# Patient Record
Sex: Female | Born: 1995 | Race: White | Hispanic: No | Marital: Single | Smoking: Former smoker
Health system: Southern US, Community
[De-identification: ages and names within clinical notes are randomized; demographics above are authoritative.]

## PROBLEM LIST (undated history)

## (undated) DIAGNOSIS — F329 Major depressive disorder, single episode, unspecified: Secondary | ICD-10-CM

## (undated) DIAGNOSIS — F4310 Post-traumatic stress disorder, unspecified: Secondary | ICD-10-CM

## (undated) DIAGNOSIS — F913 Oppositional defiant disorder: Secondary | ICD-10-CM

## (undated) DIAGNOSIS — F1990 Other psychoactive substance use, unspecified, uncomplicated: Secondary | ICD-10-CM

## (undated) DIAGNOSIS — J45909 Unspecified asthma, uncomplicated: Secondary | ICD-10-CM

## (undated) DIAGNOSIS — F419 Anxiety disorder, unspecified: Secondary | ICD-10-CM

## (undated) DIAGNOSIS — Z789 Other specified health status: Secondary | ICD-10-CM

## (undated) DIAGNOSIS — F32A Depression, unspecified: Secondary | ICD-10-CM

## (undated) DIAGNOSIS — F909 Attention-deficit hyperactivity disorder, unspecified type: Secondary | ICD-10-CM

## (undated) HISTORY — DX: Other psychoactive substance use, unspecified, uncomplicated: F19.90

## (undated) HISTORY — DX: Post-traumatic stress disorder, unspecified: F43.10

## (undated) HISTORY — PX: NO PAST SURGERIES: SHX2092

## (undated) HISTORY — DX: Oppositional defiant disorder: F91.3

## (undated) HISTORY — DX: Unspecified asthma, uncomplicated: J45.909

---
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
## 2014-01-05 ENCOUNTER — Encounter: Payer: Self-pay | Admitting: Obstetrics

## 2014-01-27 ENCOUNTER — Other Ambulatory Visit: Payer: Self-pay | Admitting: Obstetrics

## 2014-01-27 DIAGNOSIS — Z3041 Encounter for surveillance of contraceptive pills: Secondary | ICD-10-CM

## 2014-01-27 MED ORDER — LEVONORGESTREL-ETHINYL ESTRAD 0.15-30 MG-MCG PO TABS: 1.0000 | ORAL_TABLET | Freq: Every day | ORAL | Status: DC

## 2014-03-16 ENCOUNTER — Telehealth: Payer: Self-pay | Admitting: *Deleted

## 2014-03-16 NOTE — Telephone Encounter (Signed)
Pt called to office to ask if she needs any labs for ovulation or to become pregnancy.  Pt states that she is on birth control but would like to conceive in future.  Pt has concerns with becoming pregnant due to several family members not being able to conceive.  Pt made aware of conception and birth control, advised that further lab work may be needed if conception not achieved after several months.  Pt made aware that she could schedule a pre conception visit if she would like to discuss with Dr Jodi Mourning.  Pt states that she has follow up appt next week and will discuss at that visit.

## 2014-03-24 ENCOUNTER — Ambulatory Visit: Payer: Medicaid Other | Admitting: Obstetrics

## 2014-10-02 ENCOUNTER — Emergency Department (HOSPITAL_COMMUNITY)
Admission: EM | Admit: 2014-10-02 | Discharge: 2014-10-02 | Disposition: A | Discharge status: Home / Self Care | Payer: Self-pay | Attending: Emergency Medicine | Admitting: Emergency Medicine

## 2014-10-02 ENCOUNTER — Encounter (HOSPITAL_COMMUNITY): Payer: Self-pay | Admitting: Emergency Medicine

## 2014-10-02 ENCOUNTER — Emergency Department (HOSPITAL_COMMUNITY): Payer: Medicaid Other

## 2014-10-02 DIAGNOSIS — R55 Syncope and collapse: Secondary | ICD-10-CM

## 2014-10-02 DIAGNOSIS — Z79899 Other long term (current) drug therapy: Secondary | ICD-10-CM | POA: Insufficient documentation

## 2014-10-02 DIAGNOSIS — Z87891 Personal history of nicotine dependence: Secondary | ICD-10-CM | POA: Insufficient documentation

## 2014-10-02 DIAGNOSIS — K529 Noninfective gastroenteritis and colitis, unspecified: Secondary | ICD-10-CM | POA: Insufficient documentation

## 2014-10-02 DIAGNOSIS — Z3202 Encounter for pregnancy test, result negative: Secondary | ICD-10-CM | POA: Insufficient documentation

## 2014-10-02 DIAGNOSIS — Z8659 Personal history of other mental and behavioral disorders: Secondary | ICD-10-CM | POA: Insufficient documentation

## 2014-10-02 LAB — COMPREHENSIVE METABOLIC PANEL
ALBUMIN: 3.8 g/dL (ref 3.5–5.0)
ALK PHOS: 66 U/L (ref 38–126)
ALT: 19 U/L (ref 14–54)
AST: 22 U/L (ref 15–41)
Anion gap: 8 (ref 5–15)
BILIRUBIN TOTAL: 0.7 mg/dL (ref 0.3–1.2)
BUN: 7 mg/dL (ref 6–20)
CO2: 25 mmol/L (ref 22–32)
CREATININE: 0.64 mg/dL (ref 0.44–1.00)
Calcium: 9.3 mg/dL (ref 8.9–10.3)
Chloride: 106 mmol/L (ref 101–111)
GFR calc Af Amer: >60 mL/min (ref >60)
GFR calc non Af Amer: >60 mL/min (ref >60)
Glucose, Bld: 93 mg/dL (ref 65–99)
POTASSIUM: 3.9 mmol/L (ref 3.5–5.1)
Sodium: 139 mmol/L (ref 135–145)
TOTAL PROTEIN: 6.8 g/dL (ref 6.5–8.1)

## 2014-10-02 LAB — CBC
HCT: 42.4 % (ref 36.0–46.0)
Hemoglobin: 14.1 g/dL (ref 12.0–15.0)
MCH: 31.1 pg (ref 26.0–34.0)
MCHC: 33.3 g/dL (ref 30.0–36.0)
MCV: 93.4 fL (ref 78.0–100.0)
PLATELETS: 297 10*3/uL (ref 150–400)
RBC: 4.54 MIL/uL (ref 3.87–5.11)
RDW: 12.8 % (ref 11.5–15.5)
WBC: 10 10*3/uL (ref 4.0–10.5)

## 2014-10-02 LAB — URINALYSIS, ROUTINE W REFLEX MICROSCOPIC
Bilirubin Urine: NEGATIVE
Glucose, UA: NEGATIVE mg/dL
Hgb urine dipstick: NEGATIVE
KETONES UR: 15 mg/dL — AB
NITRITE: NEGATIVE
PH: 6.5 (ref 5.0–8.0)
PROTEIN: NEGATIVE mg/dL
SPECIFIC GRAVITY, URINE: 1.018 (ref 1.005–1.030)
Urobilinogen, UA: 1 mg/dL (ref 0.0–1.0)

## 2014-10-02 LAB — POC URINE PREG, ED: PREG TEST UR: NEGATIVE

## 2014-10-02 LAB — URINE MICROSCOPIC-ADD ON

## 2014-10-02 LAB — LIPASE, BLOOD: Lipase: 17 U/L — ABNORMAL LOW (ref 22–51)

## 2014-10-02 MED ORDER — ONDANSETRON 4 MG PO TBDP: 4.0000 mg | ORAL_TABLET | Freq: Three times a day (TID) | ORAL | Status: DC | PRN

## 2014-10-02 MED ORDER — ONDANSETRON HCL 4 MG/2ML IJ SOLN
4.0000 mg | Freq: Once | INTRAMUSCULAR | Status: AC
  Administered 2014-10-02: 4 mg via INTRAVENOUS
  Filled 2014-10-02: qty 2

## 2014-10-02 MED ORDER — SODIUM CHLORIDE 0.9 % IV BOLUS (SEPSIS)
20.0000 mL/kg | Freq: Once | INTRAVENOUS | Status: AC
  Administered 2014-10-02: 926 mL via INTRAVENOUS

## 2014-10-02 MED ORDER — ONDANSETRON 4 MG PO TBDP: 4.0000 mg | ORAL_TABLET | Freq: Once | ORAL | Status: DC

## 2014-10-02 NOTE — ED Notes (Signed)
Pt. Stated, I've had some fever up to 103 with diarrhea and today I passed out 2 times.

## 2014-10-02 NOTE — Discharge Instructions (Signed)
Viral Gastroenteritis Viral gastroenteritis is also known as stomach flu. This condition affects the stomach and intestinal tract. It can cause sudden diarrhea and vomiting. The illness typically lasts 3 to 8 days. Most people develop an immune response that eventually gets rid of the virus. While this natural response develops, the virus can make you quite ill. CAUSES  Many different viruses can cause gastroenteritis, such as rotavirus or noroviruses. You can catch one of these viruses by consuming contaminated food or water. You may also catch a virus by sharing utensils or other personal items with an infected person or by touching a contaminated surface. SYMPTOMS  The most common symptoms are diarrhea and vomiting. These problems can cause a severe loss of body fluids (dehydration) and a body salt (electrolyte) imbalance. Other symptoms may include:  Fever.  Headache.  Fatigue.  Abdominal pain. DIAGNOSIS  Your caregiver can usually diagnose viral gastroenteritis based on your symptoms and a physical exam. A stool sample may also be taken to test for the presence of viruses or other infections. TREATMENT  This illness typically goes away on its own. Treatments are aimed at rehydration. The most serious cases of viral gastroenteritis involve vomiting so severely that you are not able to keep fluids down. In these cases, fluids must be given through an intravenous line (IV). HOME CARE INSTRUCTIONS   Drink enough fluids to keep your urine clear or pale yellow. Drink small amounts of fluids frequently and increase the amounts as tolerated.  Ask your caregiver for specific rehydration instructions.  Avoid:  Foods high in sugar.  Alcohol.  Carbonated drinks.  Tobacco.  Juice.  Caffeine drinks.  Extremely hot or cold fluids.  Fatty, greasy foods.  Too much intake of anything at one time.  Dairy products until 24 to 48 hours after diarrhea stops.  You may consume probiotics.  Probiotics are active cultures of beneficial bacteria. They may lessen the amount and number of diarrheal stools in adults. Probiotics can be found in yogurt with active cultures and in supplements.  Wash your hands well to avoid spreading the virus.  Only take over-the-counter or prescription medicines for pain, discomfort, or fever as directed by your caregiver. Do not give aspirin to children. Antidiarrheal medicines are not recommended.  Ask your caregiver if you should continue to take your regular prescribed and over-the-counter medicines.  Keep all follow-up appointments as directed by your caregiver. SEEK IMMEDIATE MEDICAL CARE IF:   You are unable to keep fluids down.  You do not urinate at least once every 6 to 8 hours.  You develop shortness of breath.  You notice blood in your stool or vomit. This may look like coffee grounds.  You have abdominal pain that increases or is concentrated in one small area (localized).  You have persistent vomiting or diarrhea.  You have a fever.  The patient is a child younger than 3 months, and he or she has a fever.  The patient is a child older than 3 months, and he or she has a fever and persistent symptoms.  The patient is a child older than 3 months, and he or she has a fever and symptoms suddenly get worse.  The patient is a baby, and he or she has no tears when crying. MAKE SURE YOU:   Understand these instructions.  Will watch your condition.  Will get help right away if you are not doing well or get worse. Document Released: 02/20/2005 Document Revised: 05/15/2011 Document Reviewed: 12/07/2010  Exit[employer] Patient Information 2015 [place]. This information is not intended to replace advice given to you by your health care provider. Make sure you discuss any questions you have with your health care provider.  Syncope Syncope is a medical term for fainting or passing out. This means you lose consciousness and drop to  the ground. People are generally unconscious for less than 5 minutes. You may have some muscle twitches for up to 15 seconds before waking up and returning to normal. Syncope occurs more often in older adults, but it can happen to anyone. While most causes of syncope are not dangerous, syncope can be a sign of a serious medical problem. It is important to seek medical care.  CAUSES  Syncope is caused by a sudden drop in blood flow to the brain. The specific cause is often not determined. Factors that can bring on syncope include:  Taking medicines that lower blood pressure.  Sudden changes in posture, such as standing up quickly.  Taking more medicine than prescribed.  Standing in one place for too long.  Seizure disorders.  Dehydration and excessive exposure to heat.  Low blood sugar (hypoglycemia).  Straining to have a bowel movement.  Heart disease, irregular heartbeat, or other circulatory problems.  Fear, emotional distress, seeing blood, or severe pain. SYMPTOMS  Right before fainting, you may:  Feel dizzy or light-headed.  Feel nauseous.  See all white or all black in your field of vision.  Have cold, clammy skin. DIAGNOSIS  Your health care provider will ask about your symptoms, perform a physical exam, and perform an electrocardiogram (ECG) to record the electrical activity of your heart. Your health care provider may also perform other heart or blood tests to determine the cause of your syncope which may include:  Transthoracic echocardiogram (TTE). During echocardiography, sound waves are used to evaluate how blood flows through your heart.  Transesophageal echocardiogram (TEE).  Cardiac monitoring. This allows your health care provider to monitor your heart rate and rhythm in real time.  Holter monitor. This is a portable device that records your heartbeat and can help diagnose heart arrhythmias. It allows your health care provider to track your heart activity  for several days, if needed.  Stress tests by exercise or by giving medicine that makes the heart beat faster. TREATMENT  In most cases, no treatment is needed. Depending on the cause of your syncope, your health care provider may recommend changing or stopping some of your medicines. HOME CARE INSTRUCTIONS  Have someone stay with you until you feel stable.  Do not drive, use machinery, or play sports until your health care provider says it is okay.  Keep all follow-up appointments as directed by your health care provider.  Lie down right away if you start feeling like you might faint. Breathe deeply and steadily. Wait until all the symptoms have passed.  Drink enough fluids to keep your urine clear or pale yellow.  If you are taking blood pressure or heart medicine, get up slowly and take several minutes to sit and then stand. This can reduce dizziness. SEEK IMMEDIATE MEDICAL CARE IF:   You have a severe headache.  You have unusual pain in the chest, abdomen, or back.  You are bleeding from your mouth or rectum, or you have black or tarry stool.  You have an irregular or very fast heartbeat.  You have pain with breathing.  You have repeated fainting or seizure-like jerking during an episode.  You faint when  sitting or lying down.  You have confusion.  You have trouble walking.  You have severe weakness.  You have vision problems. If you fainted, call your local emergency services (911 in [place] Do not drive yourself to the hospital.  MAKE SURE YOU:  Understand these instructions.  Will watch your condition.  Will get help right away if you are not doing well or get worse. Document Released: 02/20/2005 Document Revised: 02/25/2013 Document Reviewed: 04/21/2011 [facility] Patient Information 2015 [place], [place]. This information is not intended to replace advice given to you by your health care provider. Make sure you discuss any questions you have with your health  care provider.

## 2014-10-02 NOTE — ED Provider Notes (Signed)
CSN: [id number]     Arrival date & time 10/02/14  1413 History   First MD Initiated Contact with Patient 10/02/14 1618     Chief Complaint  Patient presents with  . Fever  . Diarrhea  . Nausea  . Dizziness     (Consider location/radiation/quality/duration/timing/severity/associated sxs/prior Treatment) HPI Comments: 19 year old who presents with fever up to 103. Patient with some vomiting and diarrhea. Vomit is nonbloody nonbilious about 3 times a day. Patient with nonbloody diarrhea about 4 times a day. Patient passed out when she awoke, and passed out later in the day. No prior history of syncope. No dysuria, no cough, no sore throat.  Patient is a 19 y.o. female presenting with fever, diarrhea, and dizziness. The history is provided by the patient. No language interpreter was used.  Fever Max temp prior to arrival:  103 Temp source:  Subjective Severity:  Moderate Onset quality:  Sudden Duration:  2 days Timing:  Intermittent Progression:  Unchanged Chronicity:  New Relieved by:  None tried Worsened by:  Nothing tried Ineffective treatments:  None tried Associated symptoms: diarrhea   Associated symptoms: no congestion, no cough, no ear pain, no sore throat and no vomiting   Diarrhea:    Quality:  Watery   Number of occurrences:  3   Severity:  Mild   Duration:  1 day   Timing:  Intermittent   Progression:  Unchanged Diarrhea Associated symptoms: fever   Associated symptoms: no vomiting   Dizziness Associated symptoms: diarrhea   Associated symptoms: no vomiting     Past Medical History  Diagnosis Date  . ADHD (attention deficit hyperactivity disorder)   . Depressed   . Depression    History reviewed. No pertinent past surgical history. Family History  Problem Relation Age of Onset  . Cancer Sister    History  Substance Use Topics  . Smoking status: Former Smoker -- 0.20 packs/day    Types: Cigarettes    Quit date: 07/05/2011  . Smokeless tobacco: Never  Used  . Alcohol Use: No   OB History    Gravida Para Term Preterm AB TAB SAB Ectopic Multiple Living   1 0 0 0 1 0 1 0 0 0      Review of Systems  Constitutional: Positive for fever.  HENT: Negative for congestion, ear pain and sore throat.   Respiratory: Negative for cough.   Gastrointestinal: Positive for diarrhea. Negative for vomiting.  Neurological: Positive for dizziness.  All other systems reviewed and are negative.     Allergies  Review of patient's allergies indicates no known allergies.  Home Medications   Prior to Admission medications   Medication Sig Start Date End Date Taking? Authorizing Provider  levonorgestrel-ethinyl estradiol (NORDETTE) 0.15-30 MG-MCG tablet Take 1 tablet by mouth daily. 01/27/14   Shelly Bombard, MD  metroNIDAZOLE (FLAGYL) 500 MG tablet Take 1 tablet (500 mg total) by mouth 2 (two) times daily. 11/25/13   Shelly Bombard, MD  omeprazole (PRILOSEC) 20 MG capsule Take 1 capsule (20 mg total) by mouth daily. 10/20/13   Serita Grit, MD  ondansetron (ZOFRAN ODT) 4 MG disintegrating tablet Take 1 tablet (4 mg total) by mouth every 8 (eight) hours as needed for nausea or vomiting. 10/02/14   Louanne Skye, MD   BP 111/59 mmHg  Pulse 76  Temp(Src) 98.1 F (36.7 C) (Oral)  Resp 16  Ht 4\' 11"  (1.499 m)  Wt 102 lb (46.267 kg)  BMI 20.59 kg/m2  SpO2 100%  LMP 09/23/2014 Physical Exam  Constitutional: She is oriented to person, place, and time. She appears well-developed and well-nourished.  HENT:  Head: Normocephalic and atraumatic.  Right Ear: External ear normal.  Left Ear: External ear normal.  Mouth/Throat: Oropharynx is clear and moist.  Eyes: Conjunctivae and EOM are normal.  Neck: Normal range of motion. Neck supple.  Cardiovascular: Normal rate, normal heart sounds and intact distal pulses.   Pulmonary/Chest: Effort normal and breath sounds normal. She has no wheezes. She has no rales.  Abdominal: Soft. Bowel sounds are normal. There  is no tenderness. There is no rebound.  Musculoskeletal: Normal range of motion.  Neurological: She is alert and oriented to person, place, and time.  Skin: Skin is warm.  Nursing note and vitals reviewed.   ED Course  Procedures (including critical care time) Labs Review Labs Reviewed  LIPASE, BLOOD - Abnormal; Notable for the following:    Lipase 17 (*)    All other components within normal limits  URINALYSIS, ROUTINE W REFLEX MICROSCOPIC (NOT AT [facility]) - Abnormal; Notable for the following:    APPearance CLOUDY (*)    Ketones, ur 15 (*)    Leukocytes, UA MODERATE (*)    All other components within normal limits  URINE MICROSCOPIC-ADD ON - Abnormal; Notable for the following:    Squamous Epithelial / LPF MANY (*)    Bacteria, UA FEW (*)    All other components within normal limits  URINE CULTURE  COMPREHENSIVE METABOLIC PANEL  CBC  POC URINE PREG, ED    Imaging Review Dg Abd Acute W/chest  10/02/2014   CLINICAL DATA:  Vomiting and syncope.  EXAM: DG ABDOMEN ACUTE W/ 1V CHEST  COMPARISON:  10/20/2013  FINDINGS: There is no evidence of dilated bowel loops or free intraperitoneal air. No radiopaque calculi or other significant radiographic abnormality is seen. Heart size and mediastinal contours are within normal limits. Both lungs are clear.  IMPRESSION: Negative abdominal radiographs.  No acute cardiopulmonary disease.   Electronically Signed   By: Andreas Newport M.D.   On: 10/02/2014 17:16     EKG Interpretation None      MDM   Final diagnoses:  Vasovagal syncope  Gastroenteritis    18y with vomiting and diarrhea.  The symptoms started today.  Non bloody, non bilious.    No signs of abd tenderness to suggest appy or surgical abdomen.  Given the syncope, will obtain ekg, and cxr, and give ivf. Not bloody diarrhea to suggest bacterial cause or HUS. Will give zofran   Pt tolerating apple juice after zofran.  Will dc home with zofran.  Discussed signs of dehydration  and vomiting that warrant re-eval.  Family agrees with plan      Louanne Skye, MD 10/02/14 1859

## 2014-10-04 LAB — URINE CULTURE

## 2015-05-15 ENCOUNTER — Encounter (HOSPITAL_COMMUNITY): Payer: Self-pay | Admitting: Emergency Medicine

## 2015-05-15 ENCOUNTER — Emergency Department (HOSPITAL_COMMUNITY)
Admission: EM | Admit: 2015-05-15 | Discharge: 2015-05-16 | Disposition: A | Discharge status: Home / Self Care | Payer: Medicaid Other | Attending: Emergency Medicine | Admitting: Emergency Medicine

## 2015-05-15 ENCOUNTER — Emergency Department (HOSPITAL_COMMUNITY): Payer: Medicaid Other

## 2015-05-15 DIAGNOSIS — Z3202 Encounter for pregnancy test, result negative: Secondary | ICD-10-CM

## 2015-05-15 DIAGNOSIS — R55 Syncope and collapse: Secondary | ICD-10-CM | POA: Diagnosis not present

## 2015-05-15 DIAGNOSIS — O9989 Other specified diseases and conditions complicating pregnancy, childbirth and the puerperium: Secondary | ICD-10-CM | POA: Insufficient documentation

## 2015-05-15 DIAGNOSIS — Z792 Long term (current) use of antibiotics: Secondary | ICD-10-CM | POA: Insufficient documentation

## 2015-05-15 DIAGNOSIS — Z3A08 8 weeks gestation of pregnancy: Secondary | ICD-10-CM | POA: Diagnosis not present

## 2015-05-15 DIAGNOSIS — Z79899 Other long term (current) drug therapy: Secondary | ICD-10-CM | POA: Insufficient documentation

## 2015-05-15 DIAGNOSIS — Z8659 Personal history of other mental and behavioral disorders: Secondary | ICD-10-CM | POA: Insufficient documentation

## 2015-05-15 DIAGNOSIS — O209 Hemorrhage in early pregnancy, unspecified: Secondary | ICD-10-CM | POA: Insufficient documentation

## 2015-05-15 DIAGNOSIS — Z3491 Encounter for supervision of normal pregnancy, unspecified, first trimester: Secondary | ICD-10-CM

## 2015-05-15 DIAGNOSIS — Z87891 Personal history of nicotine dependence: Secondary | ICD-10-CM | POA: Insufficient documentation

## 2015-05-15 DIAGNOSIS — Z793 Long term (current) use of hormonal contraceptives: Secondary | ICD-10-CM | POA: Insufficient documentation

## 2015-05-15 LAB — BASIC METABOLIC PANEL
Anion gap: 12 (ref 5–15)
BUN: 9 mg/dL (ref 6–20)
CHLORIDE: 104 mmol/L (ref 101–111)
CO2: 20 mmol/L — AB (ref 22–32)
CREATININE: 0.59 mg/dL (ref 0.44–1.00)
Calcium: 9 mg/dL (ref 8.9–10.3)
GFR calc Af Amer: >60 mL/min (ref >60)
GFR calc non Af Amer: >60 mL/min (ref >60)
Glucose, Bld: 79 mg/dL (ref 65–99)
Potassium: 3.7 mmol/L (ref 3.5–5.1)
Sodium: 136 mmol/L (ref 135–145)

## 2015-05-15 LAB — WET PREP, GENITAL
SPERM: NONE SEEN
Trich, Wet Prep: NONE SEEN
YEAST WET PREP: NONE SEEN

## 2015-05-15 LAB — URINE MICROSCOPIC-ADD ON

## 2015-05-15 LAB — HCG, QUANTITATIVE, PREGNANCY: hCG, Beta Chain, Quant, S: <1 m[IU]/mL (ref <5)

## 2015-05-15 LAB — URINALYSIS, ROUTINE W REFLEX MICROSCOPIC
Bilirubin Urine: NEGATIVE
GLUCOSE, UA: NEGATIVE mg/dL
Ketones, ur: NEGATIVE mg/dL
LEUKOCYTES UA: NEGATIVE
Nitrite: NEGATIVE
PROTEIN: NEGATIVE mg/dL
Specific Gravity, Urine: 1.019 (ref 1.005–1.030)
pH: 6.5 (ref 5.0–8.0)

## 2015-05-15 LAB — CBC
HCT: 40.7 % (ref 36.0–46.0)
HEMOGLOBIN: 13.4 g/dL (ref 12.0–15.0)
MCH: 30.3 pg (ref 26.0–34.0)
MCHC: 32.9 g/dL (ref 30.0–36.0)
MCV: 92.1 fL (ref 78.0–100.0)
PLATELETS: 290 10*3/uL (ref 150–400)
RBC: 4.42 MIL/uL (ref 3.87–5.11)
RDW: 13.5 % (ref 11.5–15.5)
WBC: 10.4 10*3/uL (ref 4.0–10.5)

## 2015-05-15 LAB — I-STAT BETA HCG BLOOD, ED (MC, WL, AP ONLY): I-stat hCG, quantitative: <5 m[IU]/mL (ref <5)

## 2015-05-15 LAB — ABO/RH: ABO/RH(D): A POS

## 2015-05-15 LAB — CBG MONITORING, ED: GLUCOSE-CAPILLARY: 85 mg/dL (ref 65–99)

## 2015-05-15 NOTE — ED Notes (Signed)
Patient transported to Ultrasound 

## 2015-05-15 NOTE — ED Notes (Addendum)
Pt c/o syncopal episode 2 hours PTA. Pt reports that she was told she was "out for 5 minutes." Pt is currently [redacted] weeks pregnant. Pt reports that large amount of blood came out from vagina area while she was passed out. Pt is still with light colored vaginal bleeding.

## 2015-05-15 NOTE — ED Provider Notes (Signed)
CSN: [id number]     Arrival date & time 05/15/15  1512 History   First MD Initiated Contact with Patient 05/15/15 1905     Chief Complaint  Patient presents with  . Loss of Consciousness  . Vaginal Bleeding     (Consider location/radiation/quality/duration/timing/severity/associated sxs/prior Treatment) HPI Comments: Pt comes in with cc of syncope. Pt is G3P0, she is [redacted] weeks pregnant. She reports that she was at work and fainted. She had no dizziness, palpitations, chest pain, dib. Pt reports waking up in a pool of blood. Reports that she passed large clots. 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 Pt has no chest pain, dib. Pt has no hx of PE, DVT and denies any exogenous estrogen use, long distance travels or surgery in the past 6 weeks, active cancer, recent immobilization.   ROS 10 Systems reviewed and are negative for acute change except as noted in the HPI.     Patient is a 20 y.o. female presenting with syncope and vaginal bleeding. The history is provided by the patient.  Loss of Consciousness Vaginal Bleeding   Past Medical History  Diagnosis Date  . ADHD (attention deficit hyperactivity disorder)   . Depressed   . Depression    History reviewed. No pertinent past surgical history. Family History  Problem Relation Age of Onset  . Cancer Sister    Social History  Substance Use Topics  . Smoking status: Former Smoker -- 0.20 packs/day    Types: Cigarettes    Quit date: 07/05/2011  . Smokeless tobacco: Never Used  . Alcohol Use: No   OB History    Gravida Para Term Preterm AB TAB SAB Ectopic Multiple Living   1 0 0 0 1 0 1 0 0 0      Review of Systems  Cardiovascular: Positive for syncope.  Genitourinary: Positive for vaginal bleeding.      Allergies  Review of patient's allergies indicates no known allergies.  Home Medications   Prior to Admission medications   Medication Sig Start Date End Date Taking? Authorizing Provider   Prenatal Vit-Fe Fumarate-FA (PRENATAL MULTIVITAMIN) TABS tablet Take 1 tablet by mouth daily at 12 noon.   Yes Historical Provider, MD  levonorgestrel-ethinyl estradiol (NORDETTE) 0.15-30 MG-MCG tablet Take 1 tablet by mouth daily. 01/27/14   Shelly Bombard, MD  metroNIDAZOLE (FLAGYL) 500 MG tablet Take 1 tablet (500 mg total) by mouth 2 (two) times daily. 11/25/13   Shelly Bombard, MD  omeprazole (PRILOSEC) 20 MG capsule Take 1 capsule (20 mg total) by mouth daily. 10/20/13   Serita Grit, MD  ondansetron (ZOFRAN ODT) 4 MG disintegrating tablet Take 1 tablet (4 mg total) by mouth every 8 (eight) hours as needed for nausea or vomiting. 10/02/14   Louanne Skye, MD   BP 101/74 mmHg  Pulse 88  Temp(Src) 98.2 F (36.8 C) (Oral)  Resp 23  Ht 4\' 11"  (1.499 m)  Wt 114 lb 7 oz (51.909 kg)  BMI 23.10 kg/m2  SpO2 100%  LMP 03/07/2015 Physical Exam  Constitutional: She is oriented to person, place, and time. She appears well-developed.  HENT:  Head: Normocephalic and atraumatic.  Eyes: Conjunctivae and EOM are normal. Pupils are equal, round, and reactive to light.  Neck: Normal range of motion. Neck supple.  Cardiovascular: Normal rate, regular rhythm, normal heart sounds and intact distal pulses.   No murmur heard. Pulmonary/Chest: Effort normal and breath sounds normal. No respiratory distress. She has no wheezes.  Abdominal: Soft. Bowel sounds are normal. She exhibits no distension. There is no tenderness. There is no rebound and no guarding.  Genitourinary: Vagina normal and uterus normal.  External exam - normal, no lesions Speculum exam: Pt has no discharge, + blood Bimanual exam: Patient has no CMT, no adnexal tenderness or fullness and cervical os is closed  Neurological: She is alert and oriented to person, place, and time.  Skin: Skin is warm and dry.  Nursing note and vitals reviewed.   ED Course  Procedures (including critical care time) Labs Review Labs Reviewed  WET PREP,  GENITAL - Abnormal; Notable for the following:    Clue Cells Wet Prep HPF POC PRESENT (*)    WBC, Wet Prep HPF POC MANY (*)    All other components within normal limits  BASIC METABOLIC PANEL - Abnormal; Notable for the following:    CO2 20 (*)    All other components within normal limits  URINALYSIS, ROUTINE W REFLEX MICROSCOPIC (NOT AT [facility]) - Abnormal; Notable for the following:    Hgb urine dipstick LARGE (*)    All other components within normal limits  URINE MICROSCOPIC-ADD ON - Abnormal; Notable for the following:    Squamous Epithelial / LPF 0-5 (*)    Bacteria, UA RARE (*)    All other components within normal limits  CBC  HCG, QUANTITATIVE, PREGNANCY  CBG MONITORING, ED  I-STAT BETA HCG BLOOD, ED ([facility], [facility], [facility] ONLY)  ABO/RH  GC/CHLAMYDIA PROBE AMP ([place]) NOT AT [facility]    Imaging Review US Ob Comp Less 14 Wks  05/15/2015  CLINICAL DATA:  Pregnant patient with vaginal bleeding. First-trimester pregnancy. Patient reported [redacted] weeks pregnant. Passed out at work and passed large clot, persistent spotting. Pelvic discomfort. Beta HCG less than 1. EXAM: OBSTETRIC <14 WK [place] AND TRANSVAGINAL OB US TECHNIQUE: Both transabdominal and transvaginal ultrasound examinations were performed for complete evaluation of the gestation as well as the maternal uterus, adnexal regions, and pelvic cul-de-sac. Transvaginal technique was performed to assess early pregnancy. COMPARISON:  None. FINDINGS: Intrauterine gestational sac: Not present. Yolk sac:  Not present. Embryo:  Not present. Maternal uterus/adnexae: Uterus is anteverted measuring 5.6 x 2.1 x 2.9 cm. The endometrium measures 5 mm. No fluid in the endometrial canal. The right ovary measures 3.1 x 1.4 x 2.8 cm and contains physiologic follicles. An 8 mm echogenic structure within the right ovary may be a collapsed corpus luteal cyst. Blood flow is noted. The left ovary measures 3.8 x 2.9 x 3.8 cm. Within the left ovary is a 3.5 x 1.9 x 3.1 cm  irregularly-shaped cyst with possible daughter cyst. There are thick-walled tubular structures in both adnexa. Trace free fluid in the cul-de-sac. IMPRESSION: 1. No intrauterine pregnancy. No evidence of ectopic pregnancy. Patient's beta HCG is less than 1. 2. Thick-walled tubular structures in both adnexa, may reflect thickened tubes and may be sequela of pelvic inflammatory disease. No evidence of tubo-ovarian abscess sonographically. Electronically Signed   By: Jeb Levering M.D.   On: 05/15/2015 23:36   US Ob Transvaginal  05/15/2015  CLINICAL DATA:  Pregnant patient with vaginal bleeding. First-trimester pregnancy. Patient reported [redacted] weeks pregnant. Passed out at work and passed large clot, persistent spotting. Pelvic discomfort. Beta HCG less than 1. EXAM: OBSTETRIC <14 WK [place] AND TRANSVAGINAL OB US TECHNIQUE: Both transabdominal and transvaginal ultrasound examinations were performed for complete evaluation of the gestation as well as the maternal uterus, adnexal regions, and pelvic cul-de-sac.  Transvaginal technique was performed to assess early pregnancy. COMPARISON:  None. FINDINGS: Intrauterine gestational sac: Not present. Yolk sac:  Not present. Embryo:  Not present. Maternal uterus/adnexae: Uterus is anteverted measuring 5.6 x 2.1 x 2.9 cm. The endometrium measures 5 mm. No fluid in the endometrial canal. The right ovary measures 3.1 x 1.4 x 2.8 cm and contains physiologic follicles. An 8 mm echogenic structure within the right ovary may be a collapsed corpus luteal cyst. Blood flow is noted. The left ovary measures 3.8 x 2.9 x 3.8 cm. Within the left ovary is a 3.5 x 1.9 x 3.1 cm irregularly-shaped cyst with possible daughter cyst. There are thick-walled tubular structures in both adnexa. Trace free fluid in the cul-de-sac. IMPRESSION: 1. No intrauterine pregnancy. No evidence of ectopic pregnancy. Patient's beta HCG is less than 1. 2. Thick-walled tubular structures in both adnexa, may reflect  thickened tubes and may be sequela of pelvic inflammatory disease. No evidence of tubo-ovarian abscess sonographically. Electronically Signed   By: Jeb Levering M.D.   On: 05/15/2015 23:36   I have personally reviewed and evaluated these images and lab results as part of my medical decision-making.   EKG Interpretation   Date/Time:  Saturday May 15 2015 16:50:37 EST Ventricular Rate:  78 PR Interval:  136 QRS Duration: 76 QT Interval:  376 QTC Calculation: 428 R Axis:   97 Text Interpretation:  Normal sinus rhythm Rightward axis Borderline ECG No  acute changes No significant change since last tracing Confirmed by  Kathrynn Humble, MD, Thelma Comp [phone]) on 05/15/2015 9:31:21 PM      MDM   Final diagnoses:  Syncope and collapse  Encounter for pregnancy test with result negative    Pt comes in with cc of syncope. She had no prodrome. No cardiac hx, no PE hx. Pt has no hx of dysrhythmias, there is no family hx of premature CAD.  Pt's Hcg < 1. [place] ordered and [place] ordered. No abd abnormality on the exam.  Pt's cardiac exam is normal. Hb and BP are normal and the cardiac exam is normal.  Will d.c. PE warning signs verbally discussed and pt will return to the ER if she has chest pain, worsening dib, dizziness or fainting.      Varney Biles, MD 05/16/15 [id number]

## 2015-05-15 NOTE — Discharge Instructions (Signed)
We are very sorry to inform you that the ER results show no active pregnancy. We would like to have you see your gynecologist for further evaluation.   Syncope Syncope is a medical term for fainting or passing out. This means you lose consciousness and drop to the ground. People are generally unconscious for less than 5 minutes. You may have some muscle twitches for up to 15 seconds before waking up and returning to normal. Syncope occurs more often in older adults, but it can happen to anyone. While most causes of syncope are not dangerous, syncope can be a sign of a serious medical problem. It is important to seek medical care.  CAUSES  Syncope is caused by a sudden drop in blood flow to the brain. The specific cause is often not determined. Factors that can bring on syncope include:  Taking medicines that lower blood pressure.  Sudden changes in posture, such as standing up quickly.  Taking more medicine than prescribed.  Standing in one place for too long.  Seizure disorders.  Dehydration and excessive exposure to heat.  Low blood sugar (hypoglycemia).  Straining to have a bowel movement.  Heart disease, irregular heartbeat, or other circulatory problems.  Fear, emotional distress, seeing blood, or severe pain. SYMPTOMS  Right before fainting, you may:  Feel dizzy or light-headed.  Feel nauseous.  See all white or all black in your field of vision.  Have cold, clammy skin. DIAGNOSIS  Your health care provider will ask about your symptoms, perform a physical exam, and perform an electrocardiogram (ECG) to record the electrical activity of your heart. Your health care provider may also perform other heart or blood tests to determine the cause of your syncope which may include:  Transthoracic echocardiogram (TTE). During echocardiography, sound waves are used to evaluate how blood flows through your heart.  Transesophageal echocardiogram (TEE).  Cardiac monitoring. This  allows your health care provider to monitor your heart rate and rhythm in real time.  Holter monitor. This is a portable device that records your heartbeat and can help diagnose heart arrhythmias. It allows your health care provider to track your heart activity for several days, if needed.  Stress tests by exercise or by giving medicine that makes the heart beat faster. TREATMENT  In most cases, no treatment is needed. Depending on the cause of your syncope, your health care provider may recommend changing or stopping some of your medicines. HOME CARE INSTRUCTIONS  Have someone stay with you until you feel stable.  Do not drive, use machinery, or play sports until your health care provider says it is okay.  Keep all follow-up appointments as directed by your health care provider.  Lie down right away if you start feeling like you might faint. Breathe deeply and steadily. Wait until all the symptoms have passed.  Drink enough fluids to keep your urine clear or pale yellow.  If you are taking blood pressure or heart medicine, get up slowly and take several minutes to sit and then stand. This can reduce dizziness. SEEK IMMEDIATE MEDICAL CARE IF:   You have a severe headache.  You have unusual pain in the chest, abdomen, or back.  You are bleeding from your mouth or rectum, or you have black or tarry stool.  You have an irregular or very fast heartbeat.  You have pain with breathing.  You have repeated fainting or seizure-like jerking during an episode.  You faint when sitting or lying down.  You have confusion.  You have trouble walking.  You have severe weakness.  You have vision problems. If you fainted, call your local emergency services (911 in [place] Do not drive yourself to the hospital.    This information is not intended to replace advice given to you by your health care provider. Make sure you discuss any questions you have with your health care provider.     Document Released: 02/20/2005 Document Revised: 07/07/2014 Document Reviewed: 04/21/2011 Elsevier Interactive Patient Education 2016 [place]. Recurrent Pregnancy Loss Recurrent pregnancy loss is the loss of three or more pregnancies before 20 weeks of gestation. Losing three or more pregnancies in a row is rare.  CAUSES  The most common cause of recurrent pregnancy loss is an abnormal number of chromosomes in the developing baby (fetus). Chromosomes are the structures inside cells that hold all your genetic material. In most cases of recurrent pregnancy loss, a missing or extra chromosome keeps a baby from developing. It may not be possible to identify which chromosome is defective. Chromosome abnormalities can be inherited, but most of the time they occur by chance. Other possible causes of recurrent pregnancy loss include:  Being born with an abnormal womb structure (septate uterus).  Having noncancerous growths in your uterus (fibroids or polyps).  Having a disease that causes scarring in your uterus (Asherman syndrome).  Having a disease that causes your blood to clot (antiphospholipid syndrome).  Having a disease that increases bleeding (thrombophilia). RISK FACTORS The risk of recurrent pregnancy loss increases as your age increases. Other risk factors include:  Diabetes.  Thyroid disease.  Obesity.  Smoking.  Excessive alcohol or caffeine.  Recreational drug use. SIGNS AND SYMPTOMS  Vaginal bleeding.  Passing clots vaginally.  Abdominal pain or cramps.  Low back pain. DIAGNOSIS  Your health care provider can create an image of your womb using sound waves and a computer (ultrasound) to confirm your pregnancy by 5 or 6 weeks after you conceive. Ultrasound can also confirm a pregnancy loss. Your health care provider may do a complete physical exam, including your vagina and uterus (pelvic exam), to find possible causes of recurrent pregnancy loss.  Other tests  may include:  Ultrasound imaging to see if the structure of your uterus is normal or if you have polyps or fibroids.  Blood tests to see if you have a disease that causes recurrent pregnancy loss.  Blood tests to see if your blood clots normally.  Genetic testing of you and your partner. TREATMENT  In many cases, there is no specific treatment. Depending on the cause, possible treatments include:  Having your eggs fertilized outside your uterus (in vitro fertilization). By doing this, a health care provider may be able to select eggs without chromosome abnormalities.  Taking a blood thinner to prevent clotting if you have antiphospholipid syndrome.  Having corrective surgery if you have an abnormality in your uterus. HOME CARE INSTRUCTIONS  Follow all your health care provider's home instructions carefully. These may include:  Do not smoke or use recreational drugs.  Do not drink alcohol if you are pregnant.  Do not put anything in your vagina or have sex for 2 weeks after you have had a miscarriage.  If you are Rh negative and have had a miscarriage, you may need to get a shot of Rho (D) immune globulin. Ask your doctor if you need this shot.  Use birth control if you do not want to get pregnant. You can get pregnant [redacted] weeks after a miscarriage.  Get support from friends and loved ones. Miscarriage can be a sad and stressful event. SEEK MEDICAL CARE IF:   You have light vaginal bleeding or spotting while pregnant.  You have been trying to get pregnant without success.  You are struggling with sadness or depression after a miscarriage. SEEK IMMEDIATE MEDICAL CARE IF:  You have heavy vaginal bleeding and abdominal cramps.  You have fever, chills, and severe abdominal pain.   This information is not intended to replace advice given to you by your health care provider. Make sure you discuss any questions you have with your health care provider.   Document Released:  08/09/2007 Document Revised: 02/25/2013 Document Reviewed: 12/13/2012 Elsevier Interactive Patient Education [employer].

## 2015-05-16 NOTE — ED Notes (Signed)
Pt ambulating to rest room.

## 2015-05-17 LAB — GC/CHLAMYDIA PROBE AMP (~~LOC~~) NOT AT ARMC
CHLAMYDIA, DNA PROBE: POSITIVE — AB
NEISSERIA GONORRHEA: NEGATIVE

## 2015-05-18 ENCOUNTER — Telehealth (HOSPITAL_BASED_OUTPATIENT_CLINIC_OR_DEPARTMENT_OTHER): Payer: Self-pay | Admitting: Emergency Medicine

## 2015-05-18 NOTE — Telephone Encounter (Signed)
chart handoff to EDP for + Chlamydia

## 2015-05-19 ENCOUNTER — Telehealth: Payer: Self-pay | Admitting: *Deleted

## 2015-05-19 NOTE — ED Notes (Signed)
Spoke with patient, verified ID, informed of labs, treated per protocol, DHHS form faxed, patient informed to abstain for sexual activity x 10 days and notify sexual partners for evaluation and treatment

## 2017-11-27 ENCOUNTER — Ambulatory Visit: Payer: Self-pay | Admitting: Obstetrics

## 2019-01-06 ENCOUNTER — Inpatient Hospital Stay (HOSPITAL_COMMUNITY)
Admission: RE | Admit: 2019-01-06 | Discharge: 2019-01-09 | DRG: 885 | Disposition: A | Discharge status: Home / Self Care | Payer: PRIVATE HEALTH INSURANCE | Attending: Psychiatry | Admitting: Psychiatry

## 2019-01-06 ENCOUNTER — Encounter (HOSPITAL_COMMUNITY): Payer: Self-pay

## 2019-01-06 ENCOUNTER — Other Ambulatory Visit: Payer: Self-pay | Admitting: Behavioral Health

## 2019-01-06 ENCOUNTER — Other Ambulatory Visit: Payer: Self-pay

## 2019-01-06 DIAGNOSIS — F332 Major depressive disorder, recurrent severe without psychotic features: Principal | ICD-10-CM | POA: Diagnosis present

## 2019-01-06 DIAGNOSIS — Z20828 Contact with and (suspected) exposure to other viral communicable diseases: Secondary | ICD-10-CM | POA: Diagnosis present

## 2019-01-06 DIAGNOSIS — Z915 Personal history of self-harm: Secondary | ICD-10-CM | POA: Diagnosis not present

## 2019-01-06 DIAGNOSIS — F1220 Cannabis dependence, uncomplicated: Secondary | ICD-10-CM | POA: Diagnosis present

## 2019-01-06 DIAGNOSIS — R45851 Suicidal ideations: Secondary | ICD-10-CM | POA: Diagnosis present

## 2019-01-06 DIAGNOSIS — F4310 Post-traumatic stress disorder, unspecified: Secondary | ICD-10-CM | POA: Diagnosis present

## 2019-01-06 DIAGNOSIS — F329 Major depressive disorder, single episode, unspecified: Secondary | ICD-10-CM | POA: Diagnosis present

## 2019-01-06 DIAGNOSIS — F339 Major depressive disorder, recurrent, unspecified: Secondary | ICD-10-CM | POA: Diagnosis present

## 2019-01-06 DIAGNOSIS — Z87891 Personal history of nicotine dependence: Secondary | ICD-10-CM

## 2019-01-06 DIAGNOSIS — F411 Generalized anxiety disorder: Secondary | ICD-10-CM | POA: Diagnosis present

## 2019-01-06 HISTORY — DX: Other specified health status: Z78.9

## 2019-01-06 HISTORY — DX: Anxiety disorder, unspecified: F41.9

## 2019-01-06 LAB — SARS CORONAVIRUS 2 BY RT PCR (HOSPITAL ORDER, PERFORMED IN ~~LOC~~ HOSPITAL LAB): SARS Coronavirus 2: NEGATIVE

## 2019-01-06 MED ORDER — ACETAMINOPHEN 325 MG PO TABS
650.0000 mg | ORAL_TABLET | Freq: Four times a day (QID) | ORAL | Status: DC | PRN
  Filled 2019-01-06: qty 2
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
## 2019-01-06 NOTE — Progress Notes (Signed)
Psychoeducational Group Note  Date:  01/06/2019 Time:  2100  Group Topic/Focus:  Wrap-Up Group:   The focus of this group is to help patients review their daily goal of treatment and discuss progress on daily workbooks.  Participation Level: Did Not Attend  Participation Quality:  Not Applicable  Affect:  Not Applicable  Cognitive:  Not Applicable  Insight:  Not Applicable  Engagement in Group: Not Applicable  Additional Comments:  The patient did not attend group since she had yet to be admitted to the hallway.   Seleen Walter S 01/06/2019, 9:00 PM

## 2019-01-07 DIAGNOSIS — R45851 Suicidal ideations: Secondary | ICD-10-CM

## 2019-01-07 DIAGNOSIS — F339 Major depressive disorder, recurrent, unspecified: Secondary | ICD-10-CM | POA: Diagnosis present

## 2019-01-07 DIAGNOSIS — F332 Major depressive disorder, recurrent severe without psychotic features: Principal | ICD-10-CM

## 2019-01-07 LAB — COMPREHENSIVE METABOLIC PANEL
ALT: 20 U/L (ref 0–44)
AST: 17 U/L (ref 15–41)
Albumin: 4 g/dL (ref 3.5–5.0)
Alkaline Phosphatase: 62 U/L (ref 38–126)
Anion gap: 8 (ref 5–15)
BUN: 10 mg/dL (ref 6–20)
CO2: 27 mmol/L (ref 22–32)
Calcium: 9.2 mg/dL (ref 8.9–10.3)
Chloride: 103 mmol/L (ref 98–111)
Creatinine, Ser: 0.6 mg/dL (ref 0.44–1.00)
GFR calc Af Amer: >60 mL/min (ref >60)
GFR calc non Af Amer: >60 mL/min (ref >60)
Glucose, Bld: 87 mg/dL (ref 70–99)
Potassium: 3.7 mmol/L (ref 3.5–5.1)
Sodium: 138 mmol/L (ref 135–145)
Total Bilirubin: 0.4 mg/dL (ref 0.3–1.2)
Total Protein: 7.2 g/dL (ref 6.5–8.1)

## 2019-01-07 LAB — CBC
HCT: 47.2 % — ABNORMAL HIGH (ref 36.0–46.0)
Hemoglobin: 15.2 g/dL — ABNORMAL HIGH (ref 12.0–15.0)
MCH: 31.3 pg (ref 26.0–34.0)
MCHC: 32.2 g/dL (ref 30.0–36.0)
MCV: 97.1 fL (ref 80.0–100.0)
Platelets: 270 10*3/uL (ref 150–400)
RBC: 4.86 MIL/uL (ref 3.87–5.11)
RDW: 12.8 % (ref 11.5–15.5)
WBC: 10 10*3/uL (ref 4.0–10.5)
nRBC: 0 % (ref 0.0–0.2)

## 2019-01-07 LAB — LIPID PANEL
Cholesterol: 176 mg/dL (ref 0–200)
HDL: 72 mg/dL (ref >40)
LDL Cholesterol: 93 mg/dL (ref 0–99)
Total CHOL/HDL Ratio: 2.4 RATIO
Triglycerides: 54 mg/dL (ref <150)
VLDL: 11 mg/dL (ref 0–40)

## 2019-01-07 LAB — TSH: TSH: 2.253 u[IU]/mL (ref 0.350–4.500)

## 2019-01-07 LAB — RAPID URINE DRUG SCREEN, HOSP PERFORMED
Amphetamines: NOT DETECTED
Amphetamines: NOT DETECTED
Barbiturates: NOT DETECTED
Barbiturates: NOT DETECTED
Benzodiazepines: NOT DETECTED
Benzodiazepines: NOT DETECTED
Cocaine: NOT DETECTED
Cocaine: NOT DETECTED
Opiates: NOT DETECTED
Opiates: NOT DETECTED
Tetrahydrocannabinol: POSITIVE — AB
Tetrahydrocannabinol: POSITIVE — AB

## 2019-01-07 LAB — URINALYSIS, ROUTINE W REFLEX MICROSCOPIC
Bilirubin Urine: NEGATIVE
Glucose, UA: NEGATIVE mg/dL
Ketones, ur: 80 mg/dL — AB
Nitrite: NEGATIVE
Protein, ur: NEGATIVE mg/dL
Specific Gravity, Urine: 1.018 (ref 1.005–1.030)
pH: 7 (ref 5.0–8.0)

## 2019-01-07 LAB — HEMOGLOBIN A1C
Hgb A1c MFr Bld: 5 % (ref 4.8–5.6)
Mean Plasma Glucose: 96.8 mg/dL

## 2019-01-07 LAB — ETHANOL: Alcohol, Ethyl (B): <10 mg/dL (ref <10)

## 2019-01-07 LAB — PREGNANCY, URINE
Preg Test, Ur: NEGATIVE
Preg Test, Ur: NEGATIVE

## 2019-01-07 MED ORDER — TRAZODONE HCL 50 MG PO TABS
50.0000 mg | ORAL_TABLET | Freq: Every evening | ORAL | Status: DC | PRN
  Filled 2019-01-07: qty 1

## 2019-01-07 MED ORDER — MAGNESIUM HYDROXIDE 400 MG/5ML PO SUSP: 30.0000 mL | Freq: Every day | ORAL | Status: DC | PRN

## 2019-01-07 MED ORDER — SERTRALINE HCL 25 MG PO TABS
25.0000 mg | ORAL_TABLET | Freq: Every day | ORAL | Status: DC
  Administered 2019-01-07 – 2019-01-09 (×3): 25 mg via ORAL
  Filled 2019-01-07 (×6): qty 1

## 2019-01-07 MED ORDER — HYDROXYZINE HCL 25 MG PO TABS: 25.0000 mg | ORAL_TABLET | Freq: Three times a day (TID) | ORAL | Status: DC | PRN

## 2019-01-07 MED ORDER — ALUM & MAG HYDROXIDE-SIMETH 200-200-20 MG/5ML PO SUSP: 30.0000 mL | ORAL | Status: DC | PRN

## 2019-01-07 MED ORDER — PRAZOSIN HCL 1 MG PO CAPS
1.0000 mg | ORAL_CAPSULE | Freq: Every day | ORAL | Status: DC
  Administered 2019-01-07: 1 mg via ORAL
  Filled 2019-01-07 (×2): qty 1
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
## 2019-01-07 NOTE — Progress Notes (Signed)
Recreation Therapy Notes  Animal-Assisted Activity (AAA) Program Checklist/Progress Notes Patient Eligibility Criteria Checklist & Daily Group note for Rec Tx Intervention  Date: 11.3.20 Time: 67 Location: 300 Hall Group Room   AAA/T Program Assumption of Risk Form signed by Patient/ or Parent Legal Guardian  YES   Patient is free of allergies or sever asthma YES   Patient reports no fear of animals  YES   Patient reports no history of cruelty to animals  YES   Patient understands his/her participation is voluntary  YES   Patient washes hands before animal contact  YES   Patient washes hands after animal contact  YES   Behavioral Response: Engaged  Education: Contractor, Appropriate Animal Interaction   Education Outcome: Acknowledges understanding/In group clarification offered/Needs additional education.   Clinical Observations/Feedback: Pt attended and participated in activity.    Victorino Sparrow, LRT/CTRS         Victorino Sparrow A 01/07/2019 3:29 PM
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
## 2019-01-07 NOTE — Progress Notes (Signed)
D:  Patient's self inventory sheet, patient sleeps good, no sleep medication.  Fair appetite, normal energy level, anxiety #4.  Denied withdrawals.  Denied SI.  Denied physical problems.  Denied physical pain.  Goal better thoughts, fight depression.  Plans to think of the things she has, people she still has.  Does have discharge plans. A:  Medications administered per MD orders.  Emotional support and encouragement given patient. R:  Denied SI and HI, contracts for safety.  Denied A/V hallucinations.s  Safety maintained with 15 minute checks.

## 2019-01-08 NOTE — Progress Notes (Signed)
   01/07/19 2300  Psych Admission Type (Psych Patients Only)  Admission Status Voluntary  Psychosocial Assessment  Patient Complaints None  Eye Contact Fair  Facial Expression Animated  Affect Appropriate to circumstance  Speech Logical/coherent  Interaction Assertive  Motor Activity Slow  Appearance/Hygiene Unremarkable  Behavior Characteristics Appropriate to situation  Mood Anxious  Aggressive Behavior  Effect No apparent injury  Thought Process  Coherency WDL  Content WDL  Delusions None reported or observed  Perception WDL  Hallucination None reported or observed  Judgment WDL  Confusion None  Danger to Self  Current suicidal ideation? Denies  Self-Injurious Behavior No self-injurious ideation or behavior indicators observed or expressed   Danger to Others  Danger to Others None reported or observed

## 2019-01-08 NOTE — Plan of Care (Signed)
  Problem: Education: Goal: Mental status will improve Outcome: Progressing   Problem: Coping: Goal: Ability to demonstrate self-control will improve Outcome: Progressing   Problem: Safety: Goal: Periods of time without injury will increase Outcome: Progressing

## 2019-01-08 NOTE — Progress Notes (Signed)
Recreation Therapy Notes  Date:  11.4.20 Time: 0930 Location: [address] Group Room  Group Topic: Stress Management  Goal Area(s) Addresses:  Patient will identify positive stress management techniques. Patient will identify benefits of using stress management post d/c.  Behavioral Response: Engaged  Intervention: Stress Management  Activity :  Guided Imagery.  LRT introduced the stress management technique of guided imagery.  LRT read a script that took patients on a mental vacation to the beach.  Patients were to listen as LRT read the script to engage in activity.  Education:  Stress Management, Discharge Planning.   Education Outcome: Acknowledges Education  Clinical Observations/Feedback:  Pt attended and participated in activity.    Victorino Sparrow, LRT/CTRS         Victorino Sparrow A 01/08/2019 12:18 PM

## 2019-01-08 NOTE — Progress Notes (Signed)
Pt refused labs this morning. Encouragement and education provided.

## 2019-01-08 NOTE — Progress Notes (Signed)
[facility] MD Progress Note  01/08/2019 11:29 AM Joan Rodriguez  MRN:  [id number] Subjective: Patient is a 23 year old female with a past psychiatric history significant for depression and anxiety who was admitted to the behavioral health hospital on 01/07/2019 secondary to suicidal ideation and worsening depression.  Objective: Patient is seen and examined.  Patient is a 23 year old female with the above-stated past psychiatric history who is seen in follow-up.  She states she is doing better.  She denied any suicidal ideation this morning.  She did state that after she took the prazosin last night that she felt as though her depression and irritability increased significantly.  We discussed stopping that today.  She denied any other side effects to her current medications.  Her vital signs are stable, she is afebrile.  She slept 6.25 hours last night.  Review of her laboratories revealed essentially normal electrolytes, again, the mild elevation of her hemoglobin and hematocrit at 15.2 and 47.2.  Her urinalysis was significantly abnormal, but had 11-20 squamous epithelial cells present.  Drug screen was positive for marijuana.  Principal Problem: <principal problem not specified> Diagnosis: Active Problems:   MDD (major depressive disorder)   Major depression, recurrent (HCC)  Total Time spent with patient: 20 minutes  Past Psychiatric History: See admission H&P  Past Medical History:  Past Medical History:  Diagnosis Date  . ADHD (attention deficit hyperactivity disorder)   . Anxiety   . Depressed   . Depression   . Medical history non-contributory     Past Surgical History:  Procedure Laterality Date  . NO PAST SURGERIES     Family History:  Family History  Problem Relation Age of Onset  . Cancer Sister    Family Psychiatric  History: See admission H&P Social History:  Social History   Substance and Sexual Activity  Alcohol Use No     Social History   Substance and Sexual  Activity  Drug Use Yes  . Types: Marijuana    Social History   Socioeconomic History  . Marital status: Single    Spouse name: Not on file  . Number of children: Not on file  . Years of education: Not on file  . Highest education level: Not on file  Occupational History  . Not on file  Social Needs  . Financial resource strain: Not on file  . Food insecurity    Worry: Not on file    Inability: Not on file  . Transportation needs    Medical: Not on file    Non-medical: Not on file  Tobacco Use  . Smoking status: Former Smoker    Packs/day: 0.20    Types: Cigarettes    Quit date: 07/05/2011    Years since quitting: 7.5  . Smokeless tobacco: Never Used  Substance and Sexual Activity  . Alcohol use: No  . Drug use: Yes    Types: Marijuana  . Sexual activity: Yes    Birth control/protection: None  Lifestyle  . Physical activity    Days per week: Not on file    Minutes per session: Not on file  . Stress: Not on file  Relationships  . Social Herbalist on phone: Not on file    Gets together: Not on file    Attends religious service: Not on file    Active member of club or organization: Not on file    Attends meetings of clubs or organizations: Not on file    Relationship status:  Not on file  Other Topics Concern  . Not on file  Social History Narrative  . Not on file   Additional Social History:    Pain Medications: None reported Prescriptions: None reported Over the Counter: None reported History of alcohol / drug use?: Yes Name of Substance 1: THC 1 - Age of First Use: 23 years old 1 - Amount (size/oz): 1 gram 1 - Frequency: daily 1 - Duration: years 1 - Last Use / Amount: 1 gram today                  Sleep: Good  Appetite:  Fair  Current Medications: Current Facility-Administered Medications  Medication Dose Route Frequency Provider Last Rate Last Dose  . acetaminophen (TYLENOL) tablet 650 mg  650 mg Oral Q6H PRN Mordecai Maes,  NP      . alum & mag hydroxide-simeth (MAALOX/MYLANTA) 200-200-20 MG/5ML suspension 30 mL  30 mL Oral Q4H PRN Sharma Covert, MD      . hydrOXYzine (ATARAX/VISTARIL) tablet 25 mg  25 mg Oral TID PRN Sharma Covert, MD      . magnesium hydroxide (MILK OF MAGNESIA) suspension 30 mL  30 mL Oral Daily PRN Sharma Covert, MD      . sertraline (ZOLOFT) tablet 25 mg  25 mg Oral Daily Sharma Covert, MD   25 mg at 01/08/19 0909  . traZODone (DESYREL) tablet 50 mg  50 mg Oral QHS PRN Sharma Covert, MD        Lab Results:  Results for orders placed or performed during the hospital encounter of 01/06/19 (from the past 48 hour(s))  SARS Coronavirus 2 by RT PCR (hospital order, performed in [facility] hospital lab) Nasopharyngeal Nasopharyngeal Swab     Status: None   Collection Time: 01/06/19  3:44 PM   Specimen: Nasopharyngeal Swab  Result Value Ref Range   SARS Coronavirus 2 NEGATIVE NEGATIVE    Comment: (NOTE) If result is NEGATIVE SARS-CoV-2 target nucleic acids are NOT DETECTED. The SARS-CoV-2 RNA is generally detectable in upper and lower  respiratory specimens during the acute phase of infection. The lowest  concentration of SARS-CoV-2 viral copies this assay can detect is 250  copies / mL. A negative result does not preclude SARS-CoV-2 infection  and should not be used as the sole basis for treatment or other  patient management decisions.  A negative result may occur with  improper specimen collection / handling, submission of specimen other  than nasopharyngeal swab, presence of viral mutation(s) within the  areas targeted by this assay, and inadequate number of viral copies  (<250 copies / mL). A negative result must be combined with clinical  observations, patient history, and epidemiological information. If result is POSITIVE SARS-CoV-2 target nucleic acids are DETECTED. The SARS-CoV-2 RNA is generally detectable in upper and lower  respiratory specimens  dur ing the acute phase of infection.  Positive  results are indicative of active infection with SARS-CoV-2.  Clinical  correlation with patient history and other diagnostic information is  necessary to determine patient infection status.  Positive results do  not rule out bacterial infection or co-infection with other viruses. If result is PRESUMPTIVE POSTIVE SARS-CoV-2 nucleic acids MAY BE PRESENT.   A presumptive positive result was obtained on the submitted specimen  and confirmed on repeat testing.  While 2019 novel coronavirus  (SARS-CoV-2) nucleic acids may be present in the submitted sample  additional confirmatory testing may be necessary for epidemiological  and / or clinical management purposes  to differentiate between  SARS-CoV-2 and other Sarbecovirus currently known to infect humans.  If clinically indicated additional testing with an alternate test  methodology [phone]) is advised. The SARS-CoV-2 RNA is generally  detectable in upper and lower respiratory sp ecimens during the acute  phase of infection. The expected result is Negative. Fact Sheet for Patients:  StrictlyIdeas.no Fact Sheet for Healthcare Providers: BankingDealers.co.za This test is not yet approved or cleared by the [place] FDA and has been authorized for detection and/or diagnosis of SARS-CoV-2 by FDA under an Emergency Use Authorization (EUA).  This EUA will remain in effect (meaning this test can be used) for the duration of the COVID-19 declaration under Section 564(b)(1) of the Act, 21 U.S.C. section 360bbb-3(b)(1), unless the authorization is terminated or revoked sooner. Performed at [facility], [place] [address], [place], [place] [address]   Pregnancy, urine     Status: None   Collection Time: 01/07/19  6:09 AM  Result Value Ref Range   Preg Test, Ur NEGATIVE NEGATIVE    Comment:        THE SENSITIVITY OF  THIS METHODOLOGY IS >20 mIU/mL. Performed at [facility], [place] [address] [place], [place] [address]   Urine rapid drug screen (hosp performed)not at [facility]     Status: Abnormal   Collection Time: 01/07/19  6:09 AM  Result Value Ref Range   Opiates NONE DETECTED NONE DETECTED   Cocaine NONE DETECTED NONE DETECTED   Benzodiazepines NONE DETECTED NONE DETECTED   Amphetamines NONE DETECTED NONE DETECTED   Tetrahydrocannabinol POSITIVE (A) NONE DETECTED   Barbiturates NONE DETECTED NONE DETECTED    Comment: (NOTE) DRUG SCREEN FOR MEDICAL PURPOSES ONLY.  IF CONFIRMATION IS NEEDED FOR ANY PURPOSE, NOTIFY LAB WITHIN 5 DAYS. LOWEST DETECTABLE LIMITS FOR URINE DRUG SCREEN Drug Class                     Cutoff (ng/mL) Amphetamine and metabolites    1000 Barbiturate and metabolites    200 Benzodiazepine                 [id number] Tricyclics and metabolites     300 Opiates and metabolites        300 Cocaine and metabolites        300 THC                            50 Performed at [facility], [place] [address], [place], [place] [address]   CBC     Status: Abnormal   Collection Time: 01/07/19  6:16 AM  Result Value Ref Range   WBC 10.0 4.0 - 10.5 K/uL   RBC 4.86 3.87 - 5.11 MIL/uL   Hemoglobin 15.2 (H) 12.0 - 15.0 g/dL   HCT 47.2 (H) 36.0 - 46.0 %   MCV 97.1 80.0 - 100.0 fL   MCH 31.3 26.0 - 34.0 pg   MCHC 32.2 30.0 - 36.0 g/dL   RDW 12.8 11.5 - 15.5 %   Platelets 270 150 - 400 K/uL   nRBC 0.0 0.0 - 0.2 %    Comment: Performed at [facility], [place] [address], [place], [place] [address]  Comprehensive metabolic panel     Status: None   Collection Time: 01/07/19  6:16 AM  Result Value Ref Range   Sodium 138 135 - 145 mmol/L  Potassium 3.7 3.5 - 5.1 mmol/L   Chloride 103 98 - 111 mmol/L   CO2 27 22 - 32 mmol/L   Glucose, Bld 87 70 - 99 mg/dL   BUN 10 6 - 20 mg/dL   Creatinine, Ser 0.60 0.44 - 1.00 mg/dL   Calcium 9.2 8.9 - 10.3  mg/dL   Total Protein 7.2 6.5 - 8.1 g/dL   Albumin 4.0 3.5 - 5.0 g/dL   AST 17 15 - 41 U/L   ALT 20 0 - 44 U/L   Alkaline Phosphatase 62 38 - 126 U/L   Total Bilirubin 0.4 0.3 - 1.2 mg/dL   GFR calc non Af Amer >60 >60 mL/min   GFR calc Af Amer >60 >60 mL/min   Anion gap 8 5 - 15    Comment: Performed at [facility], [place] [address] [place], [place] [address]  Ethanol     Status: None   Collection Time: 01/07/19  6:16 AM  Result Value Ref Range   Alcohol, Ethyl (B) <10 <10 mg/dL    Comment: (NOTE) Lowest detectable limit for serum alcohol is 10 mg/dL. For medical purposes only. Performed at [facility], [place] [address] [place], [place] [address]   Hemoglobin A1c     Status: None   Collection Time: 01/07/19  6:16 AM  Result Value Ref Range   Hgb A1c MFr Bld 5.0 4.8 - 5.6 %    Comment: (NOTE) Pre diabetes:          5.7%-6.4% Diabetes:              >6.4% Glycemic control for   <7.0% adults with diabetes    Mean Plasma Glucose 96.8 mg/dL    Comment: Performed at [place] [address] [place], [place] [address]  Lipid panel     Status: None   Collection Time: 01/07/19  6:16 AM  Result Value Ref Range   Cholesterol 176 0 - 200 mg/dL   Triglycerides 54 <150 mg/dL   HDL 72 >40 mg/dL   Total CHOL/HDL Ratio 2.4 RATIO   VLDL 11 0 - 40 mg/dL   LDL Cholesterol 93 0 - 99 mg/dL    Comment:        Total Cholesterol/HDL:CHD Risk Coronary Heart Disease Risk Table                     Men   Women  1/2 Average Risk   3.4   3.3  Average Risk       5.0   4.4  2 X Average Risk   9.6   7.1  3 X Average Risk  23.4   11.0        Use the calculated Patient Ratio above and the CHD Risk Table to determine the patient's CHD Risk.        ATP III CLASSIFICATION (LDL):  <100     mg/dL   Optimal  100-129  mg/dL   Near or Above                    Optimal  130-159  mg/dL   Borderline  160-189  mg/dL   High  >190     mg/dL   Very  High Performed at [place] [address], [place], [place] [address]   TSH     Status: None   Collection Time: 01/07/19  6:16 AM  Result Value Ref Range  TSH 2.253 0.350 - 4.500 uIU/mL    Comment: Performed by a 3rd Generation assay with a functional sensitivity of <=0.01 uIU/mL. Performed at [facility], [place] [address], [place], [place] [address]   Urinalysis, Routine w reflex microscopic     Status: Abnormal   Collection Time: 01/07/19  8:31 AM  Result Value Ref Range   Color, Urine YELLOW YELLOW   APPearance CLEAR CLEAR   Specific Gravity, Urine 1.018 1.005 - 1.030   pH 7.0 5.0 - 8.0   Glucose, UA NEGATIVE NEGATIVE mg/dL   Hgb urine dipstick MODERATE (A) NEGATIVE   Bilirubin Urine NEGATIVE NEGATIVE   Ketones, ur 80 (A) NEGATIVE mg/dL   Protein, ur NEGATIVE NEGATIVE mg/dL   Nitrite NEGATIVE NEGATIVE   Leukocytes,Ua MODERATE (A) NEGATIVE   RBC / HPF 21-50 0 - 5 RBC/hpf   WBC, UA 21-50 0 - 5 WBC/hpf   Bacteria, UA RARE (A) NONE SEEN   Squamous Epithelial / LPF 11-20 0 - 5   Mucus PRESENT     Comment: Performed at [facility], [place] [address] [place], [place] [address]  Pregnancy, urine     Status: None   Collection Time: 01/07/19  8:31 AM  Result Value Ref Range   Preg Test, Ur NEGATIVE NEGATIVE    Comment:        THE SENSITIVITY OF THIS METHODOLOGY IS >20 mIU/mL. Performed at [facility], [place] [address], [place], [place] [address]   Urine rapid drug screen (hosp performed)not at [facility]     Status: Abnormal   Collection Time: 01/07/19  8:31 AM  Result Value Ref Range   Opiates NONE DETECTED NONE DETECTED   Cocaine NONE DETECTED NONE DETECTED   Benzodiazepines NONE DETECTED NONE DETECTED   Amphetamines NONE DETECTED NONE DETECTED   Tetrahydrocannabinol POSITIVE (A) NONE DETECTED   Barbiturates NONE DETECTED NONE DETECTED    Comment: (NOTE) DRUG SCREEN FOR MEDICAL PURPOSES ONLY.  IF  CONFIRMATION IS NEEDED FOR ANY PURPOSE, NOTIFY LAB WITHIN 5 DAYS. LOWEST DETECTABLE LIMITS FOR URINE DRUG SCREEN Drug Class                     Cutoff (ng/mL) Amphetamine and metabolites    1000 Barbiturate and metabolites    200 Benzodiazepine                 [id number] Tricyclics and metabolites     300 Opiates and metabolites        300 Cocaine and metabolites        300 THC                            50 Performed at [facility], [place] [address], [place], [place] [address]     Blood Alcohol level:  Lab Results  Component Value Date   [facility] <10 01/07/2019   ETH <11 [id number]    Metabolic Disorder Labs: Lab Results  Component Value Date   HGBA1C 5.0 01/07/2019   MPG 96.8 01/07/2019   No results found for: PROLACTIN Lab Results  Component Value Date   CHOL 176 01/07/2019   TRIG 54 01/07/2019   HDL 72 01/07/2019   CHOLHDL 2.4 01/07/2019   VLDL 11 01/07/2019   LDLCALC 93 01/07/2019    Physical Findings: AIMS: Facial and Oral Movements Muscles of Facial Expression: None, normal Lips and Perioral Area: None, normal Jaw: None,  normal Tongue: None, normal,Extremity Movements Upper (arms, wrists, hands, fingers): None, normal Lower (legs, knees, ankles, toes): None, normal, Trunk Movements Neck, shoulders, hips: None, normal, Overall Severity Severity of abnormal movements (highest score from questions above): None, normal Incapacitation due to abnormal movements: None, normal Patient's awareness of abnormal movements (rate only patient's report): No Awareness, Dental Status Current problems with teeth and/or dentures?: No Does patient usually wear dentures?: No  CIWA:    COWS:  COWS Total Score: 1  Musculoskeletal: Strength & Muscle Tone: within normal limits Gait & Station: normal Patient leans: N/A  Psychiatric Specialty Exam: Physical Exam  Nursing note and vitals reviewed. Constitutional: She is oriented to person, place, and time. She  appears well-developed and well-nourished.  HENT:  Head: Normocephalic and atraumatic.  Respiratory: Effort normal.  Neurological: She is alert and oriented to person, place, and time.    ROS  Blood pressure (!) 111/95, pulse 92, temperature 98.4 F (36.9 C), temperature source Oral, resp. rate 18, height 4\' 11"  (1.499 m), weight 55.8 kg, last menstrual period 12/31/2018, SpO2 100 %.Body mass index is 24.84 kg/m.  General Appearance: Casual  Eye Contact:  Fair  Speech:  Normal Rate  Volume:  Normal  Mood:  Euthymic  Affect:  Congruent  Thought Process:  Coherent and Descriptions of Associations: Intact  Orientation:  Full (Time, Place, and Person)  Thought Content:  Logical  Suicidal Thoughts:  No  Homicidal Thoughts:  No  Memory:  Immediate;   Fair Recent;   Fair Remote;   Fair  Judgement:  Intact  Insight:  Fair  Psychomotor Activity:  Normal  Concentration:  Concentration: Good and Attention Span: Good  Recall:  Good  Fund of Knowledge:  Good  Language:  Good  Akathisia:  Negative  Handed:  Right  AIMS (if indicated):     Assets:  Desire for Improvement Resilience  ADL's:  Intact  Cognition:  WNL  Sleep:  Number of Hours: 6.25     Treatment Plan Summary: Daily contact with patient to assess and evaluate symptoms and progress in treatment, Medication management and Plan : Patient is seen and examined.  Patient is a 23 year old female with the above-stated past psychiatric history who is seen in follow-up.   Diagnosis: #1 major depression, recurrent, severe without psychotic features, #2 generalized anxiety disorder, #3 posttraumatic stress disorder, #4 cannabis use disorder.  Patient is seen in follow-up.  She is doing better today.  She did have an untoward reaction with prazosin, and that will be stopped today.  I do not think she has a urinary tract infection, but clearly we will need to repeat her urinalysis to make sure there is resolution of the ketones and make  sure there is no infection.  Otherwise no other changes in her medications, and hopefully if she continues to improve we anticipate discharge in 1 to 2 days. 1.  Continue hydroxyzine 25 mg p.o. 3 times daily as needed anxiety. 2.  Continue sertraline 25 mg p.o. daily for depression and anxiety. 3.  Stop prazosin. 4.  Continue trazodone 50 mg p.o. nightly as needed insomnia. 5.  Repeat urinalysis. 6.  Disposition planning-in progress.  Sharma Covert, MD 01/08/2019, 11:29 AM

## 2019-01-08 NOTE — [facility] Suicide Risk Assessment (Signed)
[facility] INPATIENT:  Family/Significant Other Suicide Prevention Education  Suicide Prevention Education:  Contact Attempts: mother, Alphonzo Cruise [phone])  has been identified by the patient as the family member/significant other with whom the patient will be residing, and identified as the person(s) who will aid the patient in the event of a mental health crisis.  With written consent from the patient, two attempts were made to provide suicide prevention education, prior to and/or following the patient's discharge.  We were unsuccessful in providing suicide prevention education.  A suicide education pamphlet was given to the patient to share with family/significant other.  Date and time of first attempt: 01/08/2019 at 1:50pm. CSW left a HIPAA compliant voicemail and callback information. Date and time of second attempt: To be attempted  Joellen Jersey 01/08/2019, 1:53 PM

## 2019-01-08 NOTE — Tx Team (Signed)
Interdisciplinary Treatment and Diagnostic Plan Update  01/08/2019 Time of Session: 9:00am Joan Rodriguez MRN: [id number]  Principal Diagnosis: <principal problem not specified>  Secondary Diagnoses: Active Problems:   MDD (major depressive disorder)   Major depression, recurrent (HCC)   Current Medications:  Current Facility-Administered Medications  Medication Dose Route Frequency Provider Last Rate Last Dose  . acetaminophen (TYLENOL) tablet 650 mg  650 mg Oral Q6H PRN Mordecai Maes, NP      . alum & mag hydroxide-simeth (MAALOX/MYLANTA) 200-200-20 MG/5ML suspension 30 mL  30 mL Oral Q4H PRN Sharma Covert, MD      . hydrOXYzine (ATARAX/VISTARIL) tablet 25 mg  25 mg Oral TID PRN Sharma Covert, MD      . magnesium hydroxide (MILK OF MAGNESIA) suspension 30 mL  30 mL Oral Daily PRN Sharma Covert, MD      . prazosin (MINIPRESS) capsule 1 mg  1 mg Oral QHS Sharma Covert, MD   1 mg at 01/07/19 2200  . sertraline (ZOLOFT) tablet 25 mg  25 mg Oral Daily Sharma Covert, MD   25 mg at 01/08/19 0909  . traZODone (DESYREL) tablet 50 mg  50 mg Oral QHS PRN Sharma Covert, MD       PTA Medications: Medications Prior to Admission  Medication Sig Dispense Refill Last Dose  . levonorgestrel-ethinyl estradiol (NORDETTE) 0.15-30 MG-MCG tablet Take 1 tablet by mouth daily. (Patient not taking: Reported on 01/07/2019) 1 Package 11 Not Taking at Unknown time  . metroNIDAZOLE (FLAGYL) 500 MG tablet Take 1 tablet (500 mg total) by mouth 2 (two) times daily. (Patient not taking: Reported on 01/07/2019) 14 tablet 2 Completed Course at Unknown time  . omeprazole (PRILOSEC) 20 MG capsule Take 1 capsule (20 mg total) by mouth daily. (Patient not taking: Reported on 01/07/2019) 30 capsule 1 Not Taking at Unknown time  . ondansetron (ZOFRAN ODT) 4 MG disintegrating tablet Take 1 tablet (4 mg total) by mouth every 8 (eight) hours as needed for nausea or vomiting. (Patient not taking:  Reported on 01/07/2019) 20 tablet 0 Not Taking at Unknown time    Patient Stressors: Loss of both Fiancee from a murder and Bio-father  Patient Strengths: Ability for insight Communication skills Physical Health Supportive family/friends  Treatment Modalities: Medication Management, Group therapy, Case management,  1 to 1 session with clinician, Psychoeducation, Recreational therapy.   Physician Treatment Plan for Primary Diagnosis: <principal problem not specified> Long Term Goal(s):     Short Term Goals:    Medication Management: Evaluate patient's response, side effects, and tolerance of medication regimen.  Therapeutic Interventions: 1 to 1 sessions, Unit Group sessions and Medication administration.  Evaluation of Outcomes: Progressing  Physician Treatment Plan for Secondary Diagnosis: Active Problems:   MDD (major depressive disorder)   Major depression, recurrent ([place])  Long Term Goal(s):     Short Term Goals:       Medication Management: Evaluate patient's response, side effects, and tolerance of medication regimen.  Therapeutic Interventions: 1 to 1 sessions, Unit Group sessions and Medication administration.  Evaluation of Outcomes: Progressing   RN Treatment Plan for Primary Diagnosis: <principal problem not specified> Long Term Goal(s): Knowledge of disease and therapeutic regimen to maintain health will improve  Short Term Goals: Ability to verbalize feelings will improve, Ability to identify and develop effective coping behaviors will improve and Compliance with prescribed medications will improve  Medication Management: RN will administer medications as ordered by provider, will assess and  evaluate patient's response and provide education to patient for prescribed medication. RN will report any adverse and/or side effects to prescribing provider.  Therapeutic Interventions: 1 on 1 counseling sessions, Psychoeducation, Medication administration, Evaluate  responses to treatment, Monitor vital signs and CBGs as ordered, Perform/monitor CIWA, COWS, AIMS and Fall Risk screenings as ordered, Perform wound care treatments as ordered.  Evaluation of Outcomes: Progressing   LCSW Treatment Plan for Primary Diagnosis: <principal problem not specified> Long Term Goal(s): Safe transition to appropriate next level of care at discharge, Engage patient in therapeutic group addressing interpersonal concerns.  Short Term Goals: Engage patient in aftercare planning with referrals and resources, Increase social support, Increase emotional regulation, Identify triggers associated with mental health/substance abuse issues and Increase skills for wellness and recovery  Therapeutic Interventions: Assess for all discharge needs, 1 to 1 time with Social worker, Explore available resources and support systems, Assess for adequacy in community support network, Educate family and significant other(s) on suicide prevention, Complete Psychosocial Assessment, Interpersonal group therapy.  Evaluation of Outcomes: Progressing   Progress in Treatment: Attending groups: Yes. Participating in groups: Yes. Taking medication as prescribed: Yes. Toleration medication: Yes. Family/Significant other contact made: No, will contact:  mother. Patient understands diagnosis: Yes. Discussing patient identified problems/goals with staff: Yes. Medical problems stabilized or resolved: Yes. Denies suicidal/homicidal ideation: Yes. Issues/concerns per patient self-inventory: Yes.  New problem(s) identified: Yes, Describe:  grief and loss, PTSD  New Short Term/Long Term Goal(s): detox, medication management for mood stabilization; elimination of SI thoughts; development of comprehensive mental wellness/sobriety plan.  Patient Goals:  "Get on an anti-depressant."  Discharge Plan or Barriers: Patient reports her mother has coordinated outpatient follow up. CSW will contact mother to  confirm.  Reason for Continuation of Hospitalization: Anxiety Depression Medication stabilization  Estimated Length of Stay: 3-5 days  Attendees: Patient: Joan Rodriguez 01/08/2019 10:05 AM  Physician: Queen Blossom 01/08/2019 10:05 AM  Nursing: Elberta Fortis, RN 01/08/2019 10:05 AM  RN Care Manager: 01/08/2019 10:05 AM  Social Worker: Stephanie Acre, [place] 01/08/2019 10:05 AM  Recreational Therapist:  01/08/2019 10:05 AM  Other: Harriett Sine, NP 01/08/2019 10:05 AM  Other:  01/08/2019 10:05 AM  Other: 01/08/2019 10:05 AM    Scribe for Treatment Team: Joellen Jersey, [place] 01/08/2019 10:05 AM

## 2019-01-08 NOTE — Progress Notes (Signed)
Patient ID: KATHYLEEN JAKUBIAK, female   DOB: 07-19-1995, 23 y.o.   MRN: [id number]   [place] NOVEL CORONAVIRUS (COVID-19) DAILY CHECK-OFF SYMPTOMS - answer yes or no to each - every day NO YES  Have you had a fever in the past 24 hours?  . Fever (Temp > 37.80C / 100F) X   Have you had any of these symptoms in the past 24 hours? . New Cough .  Sore Throat  .  Shortness of Breath .  Difficulty Breathing .  Unexplained Body Aches   X   Have you had any one of these symptoms in the past 24 hours not related to allergies?   . Runny Nose .  Nasal Congestion .  Sneezing   X   If you have had runny nose, nasal congestion, sneezing in the past 24 hours, has it worsened?  X   EXPOSURES - check yes or no X   Have you traveled outside the state in the past 14 days?  X   Have you been in contact with someone with a confirmed diagnosis of COVID-19 or PUI in the past 14 days without wearing appropriate PPE?  X   Have you been living in the same home as a person with confirmed diagnosis of COVID-19 or a PUI (household contact)?    X   Have you been diagnosed with COVID-19?    X              What to do next: Answered NO to all: Answered YES to anything:   Proceed with unit schedule Follow the [facility] Inpatient Flowsheet.

## 2019-01-08 NOTE — [facility] Group Notes (Signed)
01/08/2019 8:45am Type of Group and Topic: Psychoeducational Group: Discharge Planning Participation Level: Active  Description of Group Discharge planning group reviews patient's anticipated discharge plans and assists patients to anticipate and address any barriers to wellness/recovery in the community. Suicide prevention education is reviewed with patients in group. Therapeutic Goals 1. Patients will state their anticipated discharge plan and mental health aftercare 2. Patients will identify potential barriers to wellness in the community setting 3. Patients will engage in problem solving, solution focused discussion of ways to anticipate and address barriers to wellness/recovery   Summary of Patient Progress Plan for Discharge/Comments:  Joan Rodriguez reports that she does not have any concerns regarding her discharge planning. She reports that she plans to return home with her mother. She also reports that she plans to remain compliant with her medications at discharge.   Transportation Means: Mother    Supports: Mother    Therapeutic Modalities: Motivational Interviewing     Joan Rodriguez, MSW, [place] Worker [facility]  Phone: [phone] 01/08/2019 3:02 PM

## 2019-01-08 NOTE — Plan of Care (Signed)
Stayed in the milieu, pleasant and cooperative. No major concern.

## 2019-01-08 NOTE — Progress Notes (Signed)
[place] Group Notes:  (Nursing/MHT/Case Management/Adjunct)  Date:  01/08/2019  Time:  2030  Type of Therapy:  wrap up group  Participation Level:  Active  Participation Quality:  Appropriate, Attentive, Sharing and Supportive  Affect:  Appropriate  Cognitive:  Appropriate  Insight:  Improving  Engagement in Group:  Engaged  Modes of Intervention:  Clarification, Education and Support  Summary of Progress/Problems:  Joan Rodriguez 01/08/2019, 9:36 PM

## 2019-01-08 NOTE — [facility] Suicide Risk Assessment (Signed)
[place] INPATIENT:  Family/Significant Other Suicide Prevention Education  Suicide Prevention Education:  Education Completed; mother, Alphonzo Cruise [phone])  has been identified by the patient as the family member/significant other with whom the patient will be residing, and identified as the person(s) who will aid the patient in the event of a mental health crisis (suicidal ideations/suicide attempt).  With written consent from the patient, the family member/significant other has been provided the following suicide prevention education, prior to the and/or following the discharge of the patient.  The suicide prevention education provided includes the following:  Suicide risk factors  Suicide prevention and interventions  National Suicide Hotline telephone number  [facility] assessment telephone number  [facility] Emergency Assistance [place] and/or Residential Mobile Crisis Unit telephone number  Request made of family/significant other to:  Remove weapons (e.g., guns, rifles, knives), all items previously/currently identified as safety concern.    Remove drugs/medications (over-the-counter, prescriptions, illicit drugs), all items previously/currently identified as a safety concern.  The family member/significant other verbalizes understanding of the suicide prevention education information provided.  The family member/significant other agrees to remove the items of safety concern listed above.  Mother, Langley Gauss, has no safety concerns for the patient discharging home. She reports she will keep a close eye on the patient and she will count her medications.   Mother confirms that she has scheduled outpatient appointments for this patient through [facility]. Mother had no questions or concerns for CSW, she voiced appreciation for the call.  Joellen Jersey 01/08/2019, 2:01 PM

## 2019-01-09 MED ORDER — SERTRALINE HCL 25 MG PO TABS: 25.0000 mg | ORAL_TABLET | Freq: Every day | ORAL | 0 refills | Status: DC

## 2019-01-09 MED ORDER — HYDROXYZINE HCL 25 MG PO TABS: 25.0000 mg | ORAL_TABLET | Freq: Three times a day (TID) | ORAL | 0 refills | Status: DC | PRN

## 2019-01-09 MED ORDER — TRAZODONE HCL 50 MG PO TABS: 50.0000 mg | ORAL_TABLET | Freq: Every evening | ORAL | 0 refills | Status: DC | PRN

## 2019-01-09 NOTE — [facility] Suicide Risk Assessment (Signed)
[facility] Discharge Suicide Risk Assessment   Principal Problem: <principal problem not specified> Discharge Diagnoses: Active Problems:   MDD (major depressive disorder)   Major depression, recurrent (HCC)   Total Time spent with patient: 20 minutes  Musculoskeletal: Strength & Muscle Tone: within normal limits Gait & Station: normal Patient leans: N/A  Psychiatric Specialty Exam: Review of Systems  All other systems reviewed and are negative.   Blood pressure 118/75, pulse (!) 109, temperature 98.1 F (36.7 C), temperature source Oral, resp. rate 16, height 4\' 11"  (1.499 m), weight 55.8 kg, last menstrual period 12/31/2018, SpO2 100 %.Body mass index is 24.84 kg/m.  General Appearance: Casual  Eye Contact::  Good  Speech:  Normal Rate409  Volume:  Normal  Mood:  Euthymic  Affect:  Congruent  Thought Process:  Coherent and Descriptions of Associations: Intact  Orientation:  Full (Time, Place, and Person)  Thought Content:  Logical  Suicidal Thoughts:  No  Homicidal Thoughts:  No  Memory:  Immediate;   Fair Recent;   Fair Remote;   Fair  Judgement:  Intact  Insight:  Fair  Psychomotor Activity:  Normal  Concentration:  Good  Recall:  Good  Fund of Knowledge:Fair  Language: Good  Akathisia:  Negative  Handed:  Right  AIMS (if indicated):     Assets:  Desire for Improvement Housing Resilience Talents/Skills  Sleep:  Number of Hours: 1.25  Cognition: WNL  ADL's:  Intact   Mental Status Per Nursing Assessment::   On Admission:  Suicidal ideation indicated by patient, Self-harm thoughts  Demographic Factors:  Caucasian, Low socioeconomic status and Unemployed  Loss Factors: NA  Historical Factors: Impulsivity  Risk Reduction Factors:   Living with another person, especially a relative and Positive social support  Continued Clinical Symptoms:  Severe Anxiety and/or Agitation Depression:   Impulsivity  Cognitive Features That Contribute To Risk:  None     Suicide Risk:  Minimal: No identifiable suicidal ideation.  Patients presenting with no risk factors but with morbid ruminations; may be classified as minimal risk based on the severity of the depressive symptoms  Follow-up Information    Patient's mother will schedule outpatient appointments Follow up.   Why: Patient's mother will schedule outpatient appointments Contact information: Patient's mother will schedule outpatient appointments          Plan Of Care/Follow-up recommendations:  Activity:  ad lib  Sharma Covert, MD 01/09/2019, 9:22 AM

## 2019-01-09 NOTE — Progress Notes (Signed)
  [facility] Adult Case Management Discharge Plan :  Will you be returning to the same living situation after discharge:  Yes,  home. At discharge, do you have transportation home?: Yes,  family picking up at 11am. Do you have the ability to pay for your medications: Yes,  [employer] insurance  Release of information consent forms completed and in the chart.  Patient to Follow up at: Follow-up Information    Patient's mother will schedule outpatient appointments Follow up.   Why: Patient's mother will schedule outpatient appointments Contact information: Patient's mother will schedule outpatient appointments       [place]. Go on 01/17/2019.   Why: Medication management with [facility] 11/13 at 1:00pm. Therapy with Tiffany [place]-[facility] on 12/18 at 2:00pm.  Contact information: [address] [place], [place] [address] [phone] (PHONE) [phone] Joylene Igo)          Next level of care provider has access to [place] and Suicide Prevention discussed: Yes,  with mother.  Have you used any form of tobacco in the last 30 days? (Cigarettes, Smokeless Tobacco, Cigars, and/or Pipes): No  Has patient been referred to the Quitline?: N/A patient is not a smoker  Patient has been referred for addiction treatment: Yes  Joellen Jersey, [place] 01/09/2019, 9:38 AM

## 2019-01-09 NOTE — Plan of Care (Signed)
Discharge note  Patient verbalizes readiness for discharge. Follow up plan explained, AVS, Transition record and SRA given. Prescriptions and teaching provided. Belongings returned and signed for. Suicide safety plan completed and signed. Patient verbalizes understanding. Patient denies SI/HI and assures this writer she will seek assistance should that change. Patient discharged to lobby where pt's father was waiting.  Problem: Education: Goal: Knowledge of [place] [facility] Education information/materials will improve Outcome: Adequate for Discharge Goal: Emotional status will improve Outcome: Adequate for Discharge Goal: Mental status will improve Outcome: Adequate for Discharge Goal: Verbalization of understanding the information provided will improve Outcome: Adequate for Discharge   Problem: Activity: Goal: Interest or engagement in activities will improve Outcome: Adequate for Discharge Goal: Sleeping patterns will improve Outcome: Adequate for Discharge   Problem: Coping: Goal: Ability to verbalize frustrations and anger appropriately will improve Outcome: Adequate for Discharge Goal: Ability to demonstrate self-control will improve Outcome: Adequate for Discharge   Problem: Health Behavior/Discharge Planning: Goal: Identification of resources available to assist in meeting health care needs will improve Outcome: Adequate for Discharge Goal: Compliance with treatment plan for underlying cause of condition will improve Outcome: Adequate for Discharge   Problem: Physical Regulation: Goal: Ability to maintain clinical measurements within normal limits will improve Outcome: Adequate for Discharge   Problem: Safety: Goal: Periods of time without injury will increase Outcome: Adequate for Discharge   Problem: Education: Goal: Utilization of techniques to improve thought processes will improve Outcome: Adequate for Discharge Goal: Knowledge of the prescribed therapeutic  regimen will improve Outcome: Adequate for Discharge   Problem: Activity: Goal: Interest or engagement in leisure activities will improve Outcome: Adequate for Discharge Goal: Imbalance in normal sleep/wake cycle will improve Outcome: Adequate for Discharge   Problem: Coping: Goal: Coping ability will improve Outcome: Adequate for Discharge Goal: Will verbalize feelings Outcome: Adequate for Discharge   Problem: Health Behavior/Discharge Planning: Goal: Ability to make decisions will improve Outcome: Adequate for Discharge Goal: Compliance with therapeutic regimen will improve Outcome: Adequate for Discharge   Problem: Role Relationship: Goal: Will demonstrate positive changes in social behaviors and relationships Outcome: Adequate for Discharge   Problem: Safety: Goal: Ability to disclose and discuss suicidal ideas will improve Outcome: Adequate for Discharge Goal: Ability to identify and utilize support systems that promote safety will improve Outcome: Adequate for Discharge   Problem: Self-Concept: Goal: Will verbalize positive feelings about self Outcome: Adequate for Discharge Goal: Level of anxiety will decrease Outcome: Adequate for Discharge   Problem: Education: Goal: Ability to make informed decisions regarding treatment will improve Outcome: Adequate for Discharge   Problem: Coping: Goal: Coping ability will improve Outcome: Adequate for Discharge   Problem: Health Behavior/Discharge Planning: Goal: Identification of resources available to assist in meeting health care needs will improve Outcome: Adequate for Discharge   Problem: Medication: Goal: Compliance with prescribed medication regimen will improve Outcome: Adequate for Discharge   Problem: Self-Concept: Goal: Ability to disclose and discuss suicidal ideas will improve Outcome: Adequate for Discharge Goal: Will verbalize positive feelings about self Outcome: Adequate for Discharge
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
## 2019-07-28 ENCOUNTER — Emergency Department (HOSPITAL_COMMUNITY)
Admission: EM | Admit: 2019-07-28 | Discharge: 2019-07-28 | Discharge status: Against Medical Advice | Payer: Medicaid Other | Attending: Emergency Medicine | Admitting: Emergency Medicine

## 2019-07-28 ENCOUNTER — Other Ambulatory Visit: Payer: Self-pay

## 2019-07-28 ENCOUNTER — Encounter (HOSPITAL_COMMUNITY): Payer: Self-pay

## 2019-07-28 DIAGNOSIS — Z5321 Procedure and treatment not carried out due to patient leaving prior to being seen by health care provider: Secondary | ICD-10-CM | POA: Insufficient documentation

## 2019-07-28 DIAGNOSIS — R1032 Left lower quadrant pain: Secondary | ICD-10-CM | POA: Insufficient documentation

## 2019-07-28 LAB — URINALYSIS, ROUTINE W REFLEX MICROSCOPIC
Bilirubin Urine: NEGATIVE
Glucose, UA: NEGATIVE mg/dL
Ketones, ur: NEGATIVE mg/dL
Nitrite: NEGATIVE
Protein, ur: 30 mg/dL — AB
RBC / HPF: >50 RBC/hpf — ABNORMAL HIGH (ref 0–5)
Specific Gravity, Urine: 1.027 (ref 1.005–1.030)
pH: 6 (ref 5.0–8.0)

## 2019-07-28 LAB — LIPASE, BLOOD: Lipase: 22 U/L (ref 11–51)

## 2019-07-28 LAB — CBC
HCT: 45.3 % (ref 36.0–46.0)
Hemoglobin: 15 g/dL (ref 12.0–15.0)
MCH: 31.2 pg (ref 26.0–34.0)
MCHC: 33.1 g/dL (ref 30.0–36.0)
MCV: 94.2 fL (ref 80.0–100.0)
Platelets: 349 10*3/uL (ref 150–400)
RBC: 4.81 MIL/uL (ref 3.87–5.11)
RDW: 12.8 % (ref 11.5–15.5)
WBC: 11.1 10*3/uL — ABNORMAL HIGH (ref 4.0–10.5)
nRBC: 0 % (ref 0.0–0.2)

## 2019-07-28 LAB — COMPREHENSIVE METABOLIC PANEL
ALT: 18 U/L (ref 0–44)
AST: 23 U/L (ref 15–41)
Albumin: 4 g/dL (ref 3.5–5.0)
Alkaline Phosphatase: 62 U/L (ref 38–126)
Anion gap: 9 (ref 5–15)
BUN: 11 mg/dL (ref 6–20)
CO2: 23 mmol/L (ref 22–32)
Calcium: 9.3 mg/dL (ref 8.9–10.3)
Chloride: 106 mmol/L (ref 98–111)
Creatinine, Ser: 0.65 mg/dL (ref 0.44–1.00)
GFR calc Af Amer: >60 mL/min (ref >60)
GFR calc non Af Amer: >60 mL/min (ref >60)
Glucose, Bld: 110 mg/dL — ABNORMAL HIGH (ref 70–99)
Potassium: 4.5 mmol/L (ref 3.5–5.1)
Sodium: 138 mmol/L (ref 135–145)
Total Bilirubin: 0.7 mg/dL (ref 0.3–1.2)
Total Protein: 6.8 g/dL (ref 6.5–8.1)

## 2019-07-28 LAB — I-STAT BETA HCG BLOOD, ED (MC, WL, AP ONLY): I-stat hCG, quantitative: <5 m[IU]/mL (ref <5)

## 2019-07-28 MED ORDER — SODIUM CHLORIDE 0.9% FLUSH: 3.0000 mL | Freq: Once | INTRAVENOUS | Status: DC

## 2019-07-28 NOTE — ED Triage Notes (Signed)
Patient complains of LLQ pain that started acutely this am upon awakening. Patient complains of nausea and vomiting with same. Pain worse with any movement

## 2019-07-28 NOTE — ED Notes (Signed)
called pt no answer

## 2019-07-28 NOTE — ED Notes (Signed)
Called for pt outside and in lobby no answer  And RR are empty

## 2020-10-31 ENCOUNTER — Emergency Department (HOSPITAL_COMMUNITY): Payer: PRIVATE HEALTH INSURANCE

## 2020-10-31 ENCOUNTER — Emergency Department (HOSPITAL_COMMUNITY)
Admission: EM | Admit: 2020-10-31 | Discharge: 2020-11-01 | Discharge status: Against Medical Advice | Payer: PRIVATE HEALTH INSURANCE | Attending: Emergency Medicine | Admitting: Emergency Medicine

## 2020-10-31 ENCOUNTER — Encounter (HOSPITAL_COMMUNITY): Payer: Self-pay | Admitting: Emergency Medicine

## 2020-10-31 ENCOUNTER — Other Ambulatory Visit: Payer: Self-pay

## 2020-10-31 DIAGNOSIS — R1011 Right upper quadrant pain: Secondary | ICD-10-CM | POA: Insufficient documentation

## 2020-10-31 LAB — COMPREHENSIVE METABOLIC PANEL
ALT: 24 U/L (ref 0–44)
AST: 20 U/L (ref 15–41)
Albumin: 3.9 g/dL (ref 3.5–5.0)
Alkaline Phosphatase: 66 U/L (ref 38–126)
Anion gap: 9 (ref 5–15)
BUN: 9 mg/dL (ref 6–20)
CO2: 24 mmol/L (ref 22–32)
Calcium: 9.2 mg/dL (ref 8.9–10.3)
Chloride: 103 mmol/L (ref 98–111)
Creatinine, Ser: 0.65 mg/dL (ref 0.44–1.00)
GFR, Estimated: >60 mL/min (ref >60)
Glucose, Bld: 111 mg/dL — ABNORMAL HIGH (ref 70–99)
Potassium: 3.8 mmol/L (ref 3.5–5.1)
Sodium: 136 mmol/L (ref 135–145)
Total Bilirubin: 0.9 mg/dL (ref 0.3–1.2)
Total Protein: 7 g/dL (ref 6.5–8.1)

## 2020-10-31 LAB — URINALYSIS, ROUTINE W REFLEX MICROSCOPIC
Bilirubin Urine: NEGATIVE
Glucose, UA: NEGATIVE mg/dL
Hgb urine dipstick: NEGATIVE
Ketones, ur: 80 mg/dL — AB
Nitrite: NEGATIVE
Protein, ur: NEGATIVE mg/dL
Specific Gravity, Urine: 1.029 (ref 1.005–1.030)
pH: 6 (ref 5.0–8.0)

## 2020-10-31 LAB — LIPASE, BLOOD: Lipase: 26 U/L (ref 11–51)

## 2020-10-31 LAB — CBC WITH DIFFERENTIAL/PLATELET
Abs Immature Granulocytes: 0.04 10*3/uL (ref 0.00–0.07)
Basophils Absolute: 0.1 10*3/uL (ref 0.0–0.1)
Basophils Relative: 0 %
Eosinophils Absolute: 0 10*3/uL (ref 0.0–0.5)
Eosinophils Relative: 0 %
HCT: 43.9 % (ref 36.0–46.0)
Hemoglobin: 14.5 g/dL (ref 12.0–15.0)
Immature Granulocytes: 0 %
Lymphocytes Relative: 15 %
Lymphs Abs: 2.2 10*3/uL (ref 0.7–4.0)
MCH: 31 pg (ref 26.0–34.0)
MCHC: 33 g/dL (ref 30.0–36.0)
MCV: 94 fL (ref 80.0–100.0)
Monocytes Absolute: 0.8 10*3/uL (ref 0.1–1.0)
Monocytes Relative: 5 %
Neutro Abs: 11.8 10*3/uL — ABNORMAL HIGH (ref 1.7–7.7)
Neutrophils Relative %: 80 %
Platelets: 358 10*3/uL (ref 150–400)
RBC: 4.67 MIL/uL (ref 3.87–5.11)
RDW: 13.1 % (ref 11.5–15.5)
WBC: 14.9 10*3/uL — ABNORMAL HIGH (ref 4.0–10.5)
nRBC: 0 % (ref 0.0–0.2)

## 2020-10-31 LAB — I-STAT BETA HCG BLOOD, ED (MC, WL, AP ONLY): I-stat hCG, quantitative: <5 m[IU]/mL (ref <5)

## 2020-10-31 NOTE — ED Triage Notes (Signed)
Pt reports right sided abdominal (10/10) pain along w/ N/V/DX1 day

## 2020-10-31 NOTE — ED Provider Notes (Signed)
Emergency Medicine Provider Triage Evaluation Note  Joan Rodriguez , a 25 y.o. female  was evaluated in triage.  Pt complains of abd pain. Located to RUQ, epigastric area. HX of ulcers. No chronic NSAID use, etoh. Worse with eating. Some NBNB emesis. Diarrhea without melena or BRBPR. No CP, SOB, cough, fever. Denies chance of pregnancy  Review of Systems  Positive: Abd pain, emesis, diarrhea  Negative: CP, SOB, fever  Physical Exam  BP 110/74 (BP Location: Right Arm)   Pulse 79   Temp 98.1 F (36.7 C) (Oral)   Resp 16   SpO2 100%  Gen:   Awake, no distress   Resp:  Normal effort  ABD:  Diffuse tenderness to RUQ, epigastric area however neg Percell Miller sign MSK:   Moves extremities without difficulty  Other:    Medical Decision Making  Medically screening exam initiated at 9:37 PM.  Appropriate orders placed.  OLWEN RUGG was informed that the remainder of the evaluation will be completed by another provider, this initial triage assessment does not replace that evaluation, and the importance of remaining in the ED until their evaluation is complete.  Abdominal pain, emesis, diarrhea   Ozella Comins A, PA-C 10/31/20 2139    Gareth Morgan, MD 11/01/20 2332

## 2020-11-01 NOTE — ED Notes (Signed)
Pt left due to wait time  

## 2021-07-20 ENCOUNTER — Emergency Department (HOSPITAL_COMMUNITY): Payer: Self-pay

## 2021-07-20 ENCOUNTER — Other Ambulatory Visit: Payer: Self-pay

## 2021-07-20 ENCOUNTER — Emergency Department (HOSPITAL_COMMUNITY)
Admission: EM | Admit: 2021-07-20 | Discharge: 2021-07-20 | Disposition: A | Discharge status: Home / Self Care | Payer: Self-pay | Attending: Emergency Medicine | Admitting: Emergency Medicine

## 2021-07-20 DIAGNOSIS — R112 Nausea with vomiting, unspecified: Secondary | ICD-10-CM | POA: Insufficient documentation

## 2021-07-20 DIAGNOSIS — R8289 Other abnormal findings on cytological and histological examination of urine: Secondary | ICD-10-CM | POA: Insufficient documentation

## 2021-07-20 DIAGNOSIS — R1011 Right upper quadrant pain: Secondary | ICD-10-CM | POA: Insufficient documentation

## 2021-07-20 LAB — COMPREHENSIVE METABOLIC PANEL
ALT: 18 U/L (ref 0–44)
AST: 18 U/L (ref 15–41)
Albumin: 3.8 g/dL (ref 3.5–5.0)
Alkaline Phosphatase: 67 U/L (ref 38–126)
Anion gap: 8 (ref 5–15)
BUN: 7 mg/dL (ref 6–20)
CO2: 25 mmol/L (ref 22–32)
Calcium: 9 mg/dL (ref 8.9–10.3)
Chloride: 106 mmol/L (ref 98–111)
Creatinine, Ser: 0.66 mg/dL (ref 0.44–1.00)
GFR, Estimated: >60 mL/min (ref >60)
Glucose, Bld: 101 mg/dL — ABNORMAL HIGH (ref 70–99)
Potassium: 3.6 mmol/L (ref 3.5–5.1)
Sodium: 139 mmol/L (ref 135–145)
Total Bilirubin: 0.4 mg/dL (ref 0.3–1.2)
Total Protein: 6.7 g/dL (ref 6.5–8.1)

## 2021-07-20 LAB — URINALYSIS, ROUTINE W REFLEX MICROSCOPIC
Bilirubin Urine: NEGATIVE
Glucose, UA: NEGATIVE mg/dL
Hgb urine dipstick: NEGATIVE
Ketones, ur: 20 mg/dL — AB
Nitrite: NEGATIVE
Protein, ur: 30 mg/dL — AB
Specific Gravity, Urine: 1.024 (ref 1.005–1.030)
Squamous Epithelial / HPF: >50 — ABNORMAL HIGH (ref 0–5)
WBC, UA: >50 WBC/hpf — ABNORMAL HIGH (ref 0–5)
pH: 7 (ref 5.0–8.0)

## 2021-07-20 LAB — CBC
HCT: 43.3 % (ref 36.0–46.0)
Hemoglobin: 14.2 g/dL (ref 12.0–15.0)
MCH: 30.8 pg (ref 26.0–34.0)
MCHC: 32.8 g/dL (ref 30.0–36.0)
MCV: 93.9 fL (ref 80.0–100.0)
Platelets: 296 10*3/uL (ref 150–400)
RBC: 4.61 MIL/uL (ref 3.87–5.11)
RDW: 13.2 % (ref 11.5–15.5)
WBC: 9.3 10*3/uL (ref 4.0–10.5)
nRBC: 0 % (ref 0.0–0.2)

## 2021-07-20 LAB — LIPASE, BLOOD: Lipase: 26 U/L (ref 11–51)

## 2021-07-20 LAB — I-STAT BETA HCG BLOOD, ED (MC, WL, AP ONLY): I-stat hCG, quantitative: <5 m[IU]/mL (ref <5)

## 2021-07-20 MED ORDER — IOHEXOL 300 MG/ML  SOLN
100.0000 mL | Freq: Once | INTRAMUSCULAR | Status: AC | PRN
  Administered 2021-07-20: 100 mL via INTRAVENOUS

## 2021-07-20 MED ORDER — LACTATED RINGERS IV BOLUS
1000.0000 mL | Freq: Once | INTRAVENOUS | Status: AC
  Administered 2021-07-20: 1000 mL via INTRAVENOUS

## 2021-07-20 MED ORDER — KETOROLAC TROMETHAMINE 30 MG/ML IJ SOLN
15.0000 mg | Freq: Once | INTRAMUSCULAR | Status: AC
  Administered 2021-07-20: 15 mg via INTRAVENOUS
  Filled 2021-07-20: qty 1

## 2021-07-20 NOTE — ED Provider Notes (Signed)
?[facility] ?Provider Note ? ? ?CSN: [id number] ?Arrival date & time: 07/20/21  0745 ? ?  ? ?History ? ?Chief Complaint  ?Patient presents with  ? Abdominal Pain  ? ? ?Joan Rodriguez is a 26 y.o. female. ? ?Patient is a 26 year old female with a history of ADHD, depression who presents today with complaint of right upper quadrant pain.  Patient reports she has been having this pain intermittently for almost a year now.  She now reports having the pain approximately 3 times a week.  Usually last 1 to 2 hours and ends up resolving spontaneously.  It does not seem to be associated with what she eats or certain movements.  She was seen in the emergency room in August for this pain and reports at that time she had an ultrasound and was told everything was normal and it was acid reflux.  However patient reports she has been taking an antacid, watches her diet and it does not seem to make the pain any better.  Sometimes when the pain is severe it does radiate into her right flank.  She will become nauseated with the pain at times but today the pain was a 10 out of 10 and caused her to vomit x2.  She did notice some blood in her emesis along with food and bile.  She does not take NSAID products, denies any new medications.  She denies any urinary symptoms except that she feels like she has to urinate a lot.  She has no dysuria urgency and denies any bowel changes.  LMP was 1 month ago.  No prior abdominal surgeries.  She denies any weight loss but does think that she has gained some weight. ? ?The history is provided by the patient and medical records.  ?Abdominal Pain ? ?  ? ?Home Medications ?Prior to Admission medications   ?Medication Sig Start Date End Date Taking? Authorizing Provider  ?Ferrous Sulfate (IRON PO) Take 1 tablet by mouth daily as needed (when iron feels low).   Yes [provider]  ?Prenatal Vit-Fe Fumarate-FA (PRENATAL PO) Take 2 tablets by mouth daily.    Yes [provider]  ?hydrOXYzine (ATARAX/VISTARIL) 25 MG tablet Take 1 tablet (25 mg total) by mouth 3 (three) times daily as needed for anxiety. ?Patient not taking: Reported on 07/20/2021 01/09/19   Lindell Spar I, NP  ?sertraline (ZOLOFT) 25 MG tablet Take 1 tablet (25 mg total) by mouth daily. For depression ?Patient not taking: Reported on 07/20/2021 01/10/19   Lindell Spar I, NP  ?traZODone (DESYREL) 50 MG tablet Take 1 tablet (50 mg total) by mouth at bedtime as needed for sleep. ?Patient not taking: Reported on 07/20/2021 01/09/19   Lindell Spar I, NP  ?   ? ?Allergies    ?Patient has no known allergies.   ? ?Review of Systems   ?Review of Systems  ?Gastrointestinal:  Positive for abdominal pain.  ? ?Physical Exam ?Updated Vital Signs ?BP 123/76 (BP Location: Right Arm)   Pulse 85   Temp 98.3 ?F (36.8 ?C) (Oral)   Resp 17   Ht '4\' 11"'$  (1.499 m)   Wt 81.6 kg   LMP 06/20/2021   SpO2 100%   BMI 36.36 kg/m?  ?Physical Exam ?Vitals and nursing note reviewed.  ?Constitutional:   ?   General: She is not in acute distress. ?   Appearance: She is well-developed.  ?HENT:  ?   Head: Normocephalic and atraumatic.  ?Eyes:  ?  Pupils: Pupils are equal, round, and reactive to light.  ?Cardiovascular:  ?   Rate and Rhythm: Normal rate and regular rhythm.  ?   Heart sounds: Normal heart sounds. No murmur heard. ?  No friction rub.  ?Pulmonary:  ?   Effort: Pulmonary effort is normal.  ?   Breath sounds: Normal breath sounds. No wheezing or rales.  ?Abdominal:  ?   General: Abdomen is flat. Bowel sounds are normal. There is no distension.  ?   Palpations: Abdomen is soft.  ?   Tenderness: There is no abdominal tenderness. There is no right CVA tenderness, left CVA tenderness, guarding or rebound.  ?Musculoskeletal:     ?   General: No tenderness. Normal range of motion.  ?   Comments: No edema  ?Skin: ?   General: Skin is warm and dry.  ?   Findings: No rash.  ?Neurological:  ?   Mental Status: She is alert and  oriented to person, place, and time.  ?   Cranial Nerves: No cranial nerve deficit.  ?Psychiatric:     ?   Behavior: Behavior normal.  ? ? ?ED Results / Procedures / Treatments   ?Labs ?(all labs ordered are listed, but only abnormal results are displayed) ?Labs Reviewed  ?COMPREHENSIVE METABOLIC PANEL - Abnormal; Notable for the following components:  ?    Result Value  ? Glucose, Bld 101 (*)   ? All other components within normal limits  ?URINALYSIS, ROUTINE W REFLEX MICROSCOPIC - Abnormal; Notable for the following components:  ? Color, Urine AMBER (*)   ? APPearance TURBID (*)   ? Ketones, ur 20 (*)   ? Protein, ur 30 (*)   ? Leukocytes,Ua LARGE (*)   ? WBC, UA >50 (*)   ? Bacteria, UA MANY (*)   ? Squamous Epithelial / LPF >50 (*)   ? All other components within normal limits  ?LIPASE, BLOOD  ?CBC  ?I-STAT BETA HCG BLOOD, ED ([facility], [facility], [facility] ONLY)  ? ? ?EKG ?None ? ?Radiology ?CT ABDOMEN PELVIS W CONTRAST ? ?Result Date: 07/20/2021 ?CLINICAL DATA:  Right-sided abdominal pain for the past year. EXAM: CT ABDOMEN AND PELVIS WITH CONTRAST TECHNIQUE: Multidetector CT imaging of the abdomen and pelvis was performed using the standard protocol following bolus administration of intravenous contrast. RADIATION DOSE REDUCTION: This exam was performed according to the departmental dose-optimization program which includes automated exposure control, adjustment of the mA and/or kV according to patient size and/or use of iterative reconstruction technique. CONTRAST:  145m OMNIPAQUE IOHEXOL 300 MG/ML  SOLN COMPARISON:  Right upper quadrant ultrasound dated November 03, 2020. CT abdomen pelvis dated August 14, 2006. FINDINGS: Lower chest: No acute abnormality. Hepatobiliary: No focal liver abnormality is seen. No gallstones, gallbladder wall thickening, or biliary dilatation. Pancreas: Unremarkable. No pancreatic ductal dilatation or surrounding inflammatory changes. Spleen: Normal in size without focal abnormality. Adrenals/Urinary  Tract: Adrenal glands are unremarkable. Kidneys are normal, without renal calculi, focal lesion, or hydronephrosis. Bladder is unremarkable. Stomach/Bowel: Stomach is within normal limits. Appendix appears normal. No evidence of bowel wall thickening, distention, or inflammatory changes. Diffuse submucosal fat deposition throughout the colon. Vascular/Lymphatic: No significant vascular findings are present. No enlarged abdominal or pelvic lymph nodes. Reproductive: The uterus and right ovary are unremarkable. 3.1 cm simple appearing cyst in the left ovary. No follow-up imaging is recommended. Other: Tiny fat containing umbilical hernia. No free fluid or pneumoperitoneum. Musculoskeletal: No acute or significant osseous findings. IMPRESSION: 1. No acute  intra-abdominal process. 2. Diffuse submucosal fat deposition throughout the colon, usually sequelae of prior inflammation or infection. Correlate for history of inflammatory bowel disease. Electronically Signed   By: Titus Dubin M.D.   On: 07/20/2021 13:12   ? ?Procedures ?Procedures  ? ? ?Medications Ordered in ED ?Medications  ?ketorolac (TORADOL) 30 MG/ML injection 15 mg (15 mg Intravenous Given 07/20/21 1148)  ?lactated ringers bolus 1,000 mL (1,000 mLs Intravenous New Bag/Given 07/20/21 1152)  ?iohexol (OMNIPAQUE) 300 MG/ML solution 100 mL (100 mLs Intravenous Contrast Given 07/20/21 1259)  ? ? ?ED Course/ Medical Decision Making/ A&P ?  ?                        ?Medical Decision Making ?Amount and/or Complexity of Data Reviewed ?Labs: ordered. ?Radiology: ordered. ? ?Risk ?Prescription drug management. ? ? ?26 year old female presenting with recurrent right upper quadrant abdominal pain.  Pain is not reproducible on exam but she did have 2 episodes of emesis earlier today related to the pain.  The pain is starting to ease off spontaneously and is now a 6 out of 10 from 10 out of 10 earlier.  Patient has not taken anything for the pain today.  However this  unfortunately has been bothering her for about a year and reports most the time the pain resolves and is not usually this severe.  There are no alleviating or exacerbating factors that she can tell.  She denies any

## 2021-07-20 NOTE — ED Triage Notes (Signed)
Pt. Stated, ive had this rt. To mid areas stomach pain for one year, everyone that has seen me says its acid reflux. ?

## 2021-07-20 NOTE — Discharge Instructions (Signed)
Continue to avoid ibuprofen and Aleve products.  Make a food diary on the days to have pain to see if there is any correlation to why you are getting pain.  It is okay to take Tylenol if you get the pain. ?

## 2021-08-05 ENCOUNTER — Encounter: Payer: Self-pay | Admitting: *Deleted

## 2021-08-11 ENCOUNTER — Ambulatory Visit (INDEPENDENT_AMBULATORY_CARE_PROVIDER_SITE_OTHER): Payer: Self-pay | Admitting: Physician Assistant

## 2021-08-11 ENCOUNTER — Encounter: Payer: Self-pay | Admitting: Physician Assistant

## 2021-08-11 VITALS — BP 118/80 | HR 88 | Ht 59.0 in | Wt 165.0 lb

## 2021-08-11 DIAGNOSIS — R935 Abnormal findings on diagnostic imaging of other abdominal regions, including retroperitoneum: Secondary | ICD-10-CM

## 2021-08-11 DIAGNOSIS — R1031 Right lower quadrant pain: Secondary | ICD-10-CM

## 2021-08-11 MED ORDER — HYOSCYAMINE SULFATE 0.125 MG SL SUBL: SUBLINGUAL_TABLET | SUBLINGUAL | 2 refills | Status: DC

## 2021-08-11 NOTE — Progress Notes (Signed)
Chief Complaint: Right lower quadrant pain  HPI:    [name] is a 26 year old female with a past medical history of anxiety, depression, PTSD and multiple others, who was referred to me by Salome Arnt, MD for a complaint of right lower quadrant pain.      10/31/2020 right upper quadrant ultrasound with unremarkable results.    07/20/2021 patient seen in the ER for right upper quadrant pain which she reported is intermittent for almost a year about 3 times a week that would last 1 to 2 hours and would resolve spontaneously.  She had previously been seen in the ER in August 2022 and had an ultrasound was told everything was normal it is likely reflux.  Reported taking an acid and watching her diet at the time but did not help the pain.  Radiated to her right flank.  Nauseated with the pain at times rated as a 10/10 and occasional episodes of vomiting.  Labs at the time include a CMP with a minimally elevated glucose at 101.  Urinalysis with a large amount of leukocytes.  Normal CBC and lipase.  CT of the abdomen pelvis with contrast showed no acute intra-abdominal process, diffuse submucosal fat deposition throughout the colon, usually sequela of prior inflammation or infection.  She was not started on any medications as she reported a PPI seem to make her symptoms worse.  She was told to avoid Aleve and Ibuprofen.    Today, the patient presents to clinic and tells me for the past year she has been experiencing an abdominal pain typically in her right lower side.  She initially saw her PCP about this who thought it was reflux and started her on medicine which never helped.  She has been to the ER on 2 separate occasions.  Nobody has ever really been able to figure it out.  She tells me she has tried some over-the-counter remedies including teas and sea moss but nothing has really helped.  Tells me at least 1-2 times a week she will have a debilitating 10/10 pain in her right lower quadrant that is  sharp and intense and sometimes makes her go "white" per friends around her and makes her feel like she is going to pass out and oftentimes she will get nauseous and in fact has vomited on 2 separate occasions.  This pain typically last for a couple of hours and then will go away on its own.  Nothing she has tried for it has ever helped.  She cannot seem to identify any triggers.  Does not change her bowel movements during time of pain.  Does not express a history of diarrhea or constipation.  No blood in her stool.    No fever, chills, weight loss, heartburn or reflux.  Past Medical History:  Diagnosis Date   ADHD (attention deficit hyperactivity disorder)    Anxiety    Depressed    Depression    Oppositional defiant disorder    Polysubstance use disorder    PTSD (post-traumatic stress disorder)     Past Surgical History:  Procedure Laterality Date   NO PAST SURGERIES      No current outpatient medications   Allergies as of 08/11/2021   (No Known Allergies)    Family History  Problem Relation Age of Onset   Sarcoidosis Mother    Hypertension Mother    Depression Mother    Anxiety disorder Mother    Prostate cancer Father    Cancer Sister  High Cholesterol Maternal Grandmother    Cancer Paternal Grandmother        lung?    Social History   Socioeconomic History   Marital status: Single    Spouse name: Not on file   Number of children: Not on file   Years of education: Not on file   Highest education level: Not on file  Occupational History   Not on file  Tobacco Use   Smoking status: Former    Packs/day: 0.20    Types: Cigarettes    Quit date: 07/05/2011    Years since quitting: 10.1   Smokeless tobacco: Never  Substance and Sexual Activity   Alcohol use: No   Drug use: Yes    Types: Marijuana   Sexual activity: Yes    Birth control/protection: None  Other Topics Concern   Not on file  Social History Narrative   Not on file   Social Determinants of  Health   Financial Resource Strain: Not on file  Food Insecurity: Not on file  Transportation Needs: Not on file  Physical Activity: Not on file  Stress: Not on file  Social Connections: Not on file  Intimate Partner Violence: Not on file    Review of Systems:    Constitutional: No weight loss, fever or chills Skin: No rash  Cardiovascular: No chest pain Respiratory: No SOB Gastrointestinal: See HPI and otherwise negative Genitourinary: No dysuria Neurological: No headache, dizziness or syncope Musculoskeletal: No new muscle or joint pain Hematologic: No bleeding Psychiatric: No history of depression or anxiety   Physical Exam:  Vital signs: BP 118/80   Pulse 88   Ht '4\' 11"'$  (1.499 m)   Wt 165 lb (74.8 kg)   LMP 06/20/2021   BMI 33.33 kg/m    Constitutional:   Pleasant overweight Caucasian female appears to be in NAD, Well developed, Well nourished, alert and cooperative Head:  Normocephalic and atraumatic. Eyes:   PEERL, EOMI. No icterus. Conjunctiva pink. Ears:  Normal auditory acuity. Neck:  Supple Throat: Oral cavity and pharynx without inflammation, swelling or lesion.  Respiratory: Respirations even and unlabored. Lungs clear to auscultation bilaterally.   No wheezes, crackles, or rhonchi.  Cardiovascular: Normal S1, S2. No MRG. Regular rate and rhythm. No peripheral edema, cyanosis or pallor.  Gastrointestinal:  Soft, nondistended, nontender. No rebound or guarding. Normal bowel sounds. No appreciable masses or hepatomegaly. Rectal:  Not performed.  Msk:  Symmetrical without gross deformities. Without edema, no deformity or joint abnormality.  Neurologic:  Alert and  oriented x4;  grossly normal neurologically.  Skin:   Dry and intact without significant lesions or rashes. Psychiatric: Demonstrates good judgement and reason without abnormal affect or behaviors.  RELEVANT LABS AND IMAGING: CBC    Component Value Date/Time   WBC 9.3 07/20/2021 0801   RBC 4.61  07/20/2021 0801   HGB 14.2 07/20/2021 0801   HCT 43.3 07/20/2021 0801   PLT 296 07/20/2021 0801   MCV 93.9 07/20/2021 0801   MCH 30.8 07/20/2021 0801   MCHC 32.8 07/20/2021 0801   RDW 13.2 07/20/2021 0801   LYMPHSABS 2.2 10/31/2020 2145   MONOABS 0.8 10/31/2020 2145   EOSABS 0.0 10/31/2020 2145   BASOSABS 0.1 10/31/2020 2145    CMP     Component Value Date/Time   NA 139 07/20/2021 0801   K 3.6 07/20/2021 0801   CL 106 07/20/2021 0801   CO2 25 07/20/2021 0801   GLUCOSE 101 (H) 07/20/2021 0801   BUN 7  07/20/2021 0801   CREATININE 0.66 07/20/2021 0801   CALCIUM 9.0 07/20/2021 0801   PROT 6.7 07/20/2021 0801   ALBUMIN 3.8 07/20/2021 0801   AST 18 07/20/2021 0801   ALT 18 07/20/2021 0801   ALKPHOS 67 07/20/2021 0801   BILITOT 0.4 07/20/2021 0801   GFRNONAA >60 07/20/2021 0801   GFRAA >60 07/28/2019 1404    Assessment: 1.  Right lower quadrant pain: 1-2 times a week, rated as a 10/10, typically relieved within 1 to 2 hours, CT of the abdomen showing fat deposition in the colon with question of IBD, no history of hematochezia or diarrhea, previous right upper quadrant ultrasound normal and labs normal; consider IBD versus other 2.  Abnormal CT of the abdomen: As above  Plan: 1.  Scheduled patient for diagnostic colonoscopy in the [facility] with Dr. Lorenso Courier.  Did provide the patient a detailed list of risks for the procedure and she agrees to proceed. Patient is appropriate for endoscopic procedure(s) in the ambulatory ([facility]) setting.  2.  Prescribed Hyoscyamine sulfate 0.125 mg sublingual tabs 1-2 tabs every 4-6 hours as needed for pain #30 with 2 refills. 3.  Patient to follow in clinic per recommendations after above.  Ellouise Newer, PA-C [facility] 08/11/2021, 8:41 AM  Cc: Salome Arnt, MD

## 2021-08-11 NOTE — Patient Instructions (Signed)
We have sent the following medications to your pharmacy for you to pick up at your convenience: Hyoscyamine   Please follow up pending recommendations at time of colonoscopy.  If you are age 27 or older, your body mass index should be between 23-30. Your Body mass index is 33.33 kg/m. If this is out of the aforementioned range listed, please consider follow up with your Primary Care Provider.  If you are age 32 or younger, your body mass index should be between 19-25. Your Body mass index is 33.33 kg/m. If this is out of the aformentioned range listed, please consider follow up with your Primary Care Provider.   _____________________________________________________ The [facility] providers would like to encourage you to use [facility] to communicate with providers for non-urgent requests or questions.  Due to long hold times on the telephone, sending your provider a message by [facility] may be a faster and more efficient way to get a response.  Please allow 48 business hours for a response.  Please remember that this is for non-urgent requests.  _____________________________________________________  Due to recent changes in healthcare laws, you may see the results of your imaging and laboratory studies on MyChart before your provider has had a chance to review them.  We understand that in some cases there may be results that are confusing or concerning to you. Not all laboratory results come back in the same time frame and the provider may be waiting for multiple results in order to interpret others.  Please give [place] 48 hours in order for your provider to thoroughly review all the results before contacting the office for clarification of your results.

## 2021-08-11 NOTE — Progress Notes (Signed)
I agree with the assessment and plan as outlined by Ms. Lemmon. 

## 2021-08-16 ENCOUNTER — Encounter: Payer: Self-pay | Admitting: Internal Medicine

## 2021-08-16 ENCOUNTER — Ambulatory Visit (AMBULATORY_SURGERY_CENTER): Payer: Self-pay | Admitting: Internal Medicine

## 2021-08-16 VITALS — BP 93/51 | HR 63 | Temp 97.8°F | Resp 16 | Ht 59.0 in | Wt 165.0 lb

## 2021-08-16 DIAGNOSIS — D125 Benign neoplasm of sigmoid colon: Secondary | ICD-10-CM

## 2021-08-16 DIAGNOSIS — K648 Other hemorrhoids: Secondary | ICD-10-CM

## 2021-08-16 DIAGNOSIS — R1031 Right lower quadrant pain: Secondary | ICD-10-CM

## 2021-08-16 DIAGNOSIS — R933 Abnormal findings on diagnostic imaging of other parts of digestive tract: Secondary | ICD-10-CM

## 2021-08-16 MED ORDER — SODIUM CHLORIDE 0.9 % IV SOLN: 500.0000 mL | Freq: Once | INTRAVENOUS | Status: DC

## 2021-08-16 NOTE — Progress Notes (Signed)
Called to room to assist during endoscopic procedure.  Patient ID and intended procedure confirmed with present staff. Received instructions for my participation in the procedure from the performing physician.  

## 2021-08-16 NOTE — Progress Notes (Signed)
Pt awake, report to RN, VVS  °

## 2021-08-16 NOTE — Progress Notes (Signed)
GASTROENTEROLOGY PROCEDURE H&P NOTE   Primary Care Physician: Salome Arnt, MD    Reason for Procedure:   RLQ ab pain, abnormal CT scan  Plan:    Colonoscopy  Patient is appropriate for endoscopic procedure(s) in the ambulatory ([facility]) setting.  The nature of the procedure, as well as the risks, benefits, and alternatives were carefully and thoroughly reviewed with the patient. Ample time for discussion and questions allowed. The patient understood, was satisfied, and agreed to proceed.     HPI: Joan Rodriguez is a 26 y.o. female who presents for colonoscopy for evaluation of RLQ ab pain and abnormal CT scan .  Patient was most recently seen in the Gastroenterology Clinic on 08/11/21.  No interval change in medical history since that appointment. Please refer to that note for full details regarding GI history and clinical presentation.   Past Medical History:  Diagnosis Date   ADHD (attention deficit hyperactivity disorder)    Anxiety    Asthma    Depressed    Depression    Oppositional defiant disorder    Polysubstance use disorder    PTSD (post-traumatic stress disorder)     Past Surgical History:  Procedure Laterality Date   NO PAST SURGERIES      Prior to Admission medications   Medication Sig Start Date End Date Taking? Authorizing Provider  hyoscyamine (LEVSIN SL) 0.125 MG SL tablet Take 1-2 tablets by mouth every 4-6 hours as needed for pain 08/11/21   Levin Erp, PA    Current Outpatient Medications  Medication Sig Dispense Refill   hyoscyamine (LEVSIN SL) 0.125 MG SL tablet Take 1-2 tablets by mouth every 4-6 hours as needed for pain 30 tablet 2   Current Facility-Administered Medications  Medication Dose Route Frequency Provider Last Rate Last Admin   0.9 %  sodium chloride infusion  500 mL Intravenous Once Sharyn Creamer, MD        Allergies as of 08/16/2021   (No Known Allergies)    Family History  Problem Relation Age of Onset    Sarcoidosis Mother    Hypertension Mother    Depression Mother    Anxiety disorder Mother    Prostate cancer Father    Cancer Sister    High Cholesterol Maternal Grandmother    Cancer Paternal Grandmother        lung?   Stomach cancer Neg Hx    Esophageal cancer Neg Hx    Colon cancer Neg Hx     Social History   Socioeconomic History   Marital status: Single    Spouse name: Not on file   Number of children: 0   Years of education: Not on file   Highest education level: Not on file  Occupational History   Not on file  Tobacco Use   Smoking status: Former    Packs/day: 0.20    Types: Cigarettes    Quit date: 07/05/2011    Years since quitting: 10.1   Smokeless tobacco: Never  Vaping Use   Vaping Use: Never used  Substance and Sexual Activity   Alcohol use: No   Drug use: Yes    Frequency: 35.0 times per week    Types: Marijuana    Comment: last time 08/15/21   Sexual activity: Yes    Birth control/protection: None  Other Topics Concern   Not on file  Social History Narrative   Not on file   Social Determinants of Health   Financial Resource Strain:  Not on file  Food Insecurity: Not on file  Transportation Needs: Not on file  Physical Activity: Not on file  Stress: Not on file  Social Connections: Not on file  Intimate Partner Violence: Not on file    Physical Exam: Vital signs in last 24 hours: BP 112/84   Pulse 84   Temp 97.8 F (36.6 C)   Ht '4\' 11"'$  (1.499 m)   Wt 165 lb (74.8 kg)   LMP 07/21/2021 (Exact Date)   SpO2 99%   BMI 33.33 kg/m  GEN: NAD EYE: Sclerae anicteric ENT: MMM CV: Non-tachycardic Pulm: No increased WOB GI: Soft NEURO:  Alert & Oriented   [name], MD [facility]   08/16/2021 9:40 AM

## 2021-08-16 NOTE — Patient Instructions (Signed)
Read the handouts given to you by your recovery room nurse.  Keep your new office appoiintment.  YOU HAD AN ENDOSCOPIC PROCEDURE TODAY AT [facility]:   Refer to the procedure report that was given to you for any specific questions about what was found during the examination.  If the procedure report does not answer your questions, please call your gastroenterologist to clarify.  If you requested that your care partner not be given the details of your procedure findings, then the procedure report has been included in a sealed envelope for you to review at your convenience later.  YOU SHOULD EXPECT: Some feelings of bloating in the abdomen. Passage of more gas than usual.  Walking can help get rid of the air that was put into your GI tract during the procedure and reduce the bloating. If you had a lower endoscopy (such as a colonoscopy or flexible sigmoidoscopy) you may notice spotting of blood in your stool or on the toilet paper. If you underwent a bowel prep for your procedure, you may not have a normal bowel movement for a few days.  Please Note:  You might notice some irritation and congestion in your nose or some drainage.  This is from the oxygen used during your procedure.  There is no need for concern and it should clear up in a day or so.  SYMPTOMS TO REPORT IMMEDIATELY:  Following lower endoscopy (colonoscopy or flexible sigmoidoscopy):  Excessive amounts of blood in the stool  Significant tenderness or worsening of abdominal pains  Swelling of the abdomen that is new, acute  Fever of 100F or higher   For urgent or emergent issues, a gastroenterologist can be reached at any hour by calling [phone]. Do not use MyChart messaging for urgent concerns.    DIET:  We do recommend a small meal at first, but then you may proceed to your regular diet.  Drink plenty of fluids but you should avoid alcoholic beverages for 24 hours.  ACTIVITY:  You should plan to take it  easy for the rest of today and you should NOT DRIVE or use heavy machinery until tomorrow (because of the sedation medicines used during the test).    FOLLOW UP: Our staff will call the number listed on your records 24-72 hours following your procedure to check on you and address any questions or concerns that you may have regarding the information given to you following your procedure. If we do not reach you, we will leave a message.  We will attempt to reach you two times.  During this call, we will ask if you have developed any symptoms of COVID 19. If you develop any symptoms (ie: fever, flu-like symptoms, shortness of breath, cough etc.) before then, please call [phone].  If you test positive for Covid 19 in the 2 weeks post procedure, please call and report this information to [place].    If any biopsies were taken you will be contacted by phone or by letter within the next 1-3 weeks.  Please call us at [phone] if you have not heard about the biopsies in 3 weeks.    SIGNATURES/CONFIDENTIALITY: You and/or your care partner have signed paperwork which will be entered into your electronic medical record.  These signatures attest to the fact that that the information above on your After Visit Summary has been reviewed and is understood.  Full responsibility of the confidentiality of this discharge information lies with you and/or your care-partner.

## 2021-08-16 NOTE — Op Note (Signed)
[facility] Patient Name: Joan Rodriguez Procedure Date: 08/16/2021 9:51 AM MRN: [id number] Endoscopist: Sonny Masters "Joan Rodriguez ,  Age: 26 Referring MD:  Date of Birth: 1995-07-05 Gender: Female Account #: [id number] Procedure:                Colonoscopy Indications:              Abdominal pain in the right lower quadrant,                            Abnormal CT of the GI tract Medicines:                Monitored Anesthesia Care Procedure:                Pre-Anesthesia Assessment:                           - Prior to the procedure, a History and Physical                            was performed, and patient medications and                            allergies were reviewed. The patient's tolerance of                            previous anesthesia was also reviewed. The risks                            and benefits of the procedure and the sedation                            options and risks were discussed with the patient.                            All questions were answered, and informed consent                            was obtained. Prior Anticoagulants: The patient has                            taken no previous anticoagulant or antiplatelet                            agents. ASA Grade Assessment: II - A patient with                            mild systemic disease. After reviewing the risks                            and benefits, the patient was deemed in                            satisfactory condition to undergo the procedure.  After obtaining informed consent, the colonoscope                            was passed under direct vision. Throughout the                            procedure, the patient's blood pressure, pulse, and                            oxygen saturations were monitored continuously. The                            Olympus CF-HQ190L [id number]) Colonoscope was                            introduced through the anus and  advanced to the the                            terminal ileum. The colonoscopy was performed                            without difficulty. The patient tolerated the                            procedure well. The quality of the bowel                            preparation was adequate. The terminal ileum,                            ileocecal valve, appendiceal orifice, and rectum                            were photographed. Scope In: 9:54:49 AM Scope Out: 10:07:36 AM Scope Withdrawal Time: 0 hours 10 minutes 49 seconds  Total Procedure Duration: 0 hours 12 minutes 47 seconds  Findings:                 The terminal ileum appeared normal.                           A 10 mm polyp was found in the sigmoid colon. The                            polyp was sessile. The polyp was removed with a                            cold snare. Resection and retrieval were complete.                           Biopsies were taken with a cold forceps in the                            entire colon for histology.  Non-bleeding internal hemorrhoids were found during                            retroflexion. Complications:            No immediate complications. Estimated Blood Loss:     Estimated blood loss was minimal. Impression:               - The examined portion of the ileum was normal.                           - One 10 mm polyp in the sigmoid colon, removed                            with a cold snare. Resected and retrieved.                           - Non-bleeding internal hemorrhoids.                           - Biopsies were taken with a cold forceps for                            histology in the entire colon. Recommendation:           - Discharge patient to home (with escort).                           - Await pathology results.                           - The findings and recommendations were discussed                            with the patient.                           -  Return to GI clinic in 6 weeks. Sonny Masters "Joan Rodriguez,  08/16/2021 10:18:51 AM

## 2021-08-17 ENCOUNTER — Telehealth: Payer: Self-pay | Admitting: *Deleted

## 2021-08-17 NOTE — Telephone Encounter (Signed)
Left message on f/u call 

## 2021-08-18 ENCOUNTER — Encounter: Payer: Self-pay | Admitting: Internal Medicine

## 2021-09-20 ENCOUNTER — Encounter: Payer: Self-pay | Admitting: Internal Medicine

## 2021-09-20 ENCOUNTER — Ambulatory Visit (INDEPENDENT_AMBULATORY_CARE_PROVIDER_SITE_OTHER): Payer: Self-pay | Admitting: Internal Medicine

## 2021-09-20 VITALS — BP 118/68 | HR 64 | Ht 59.0 in | Wt 160.0 lb

## 2021-09-20 DIAGNOSIS — R1031 Right lower quadrant pain: Secondary | ICD-10-CM

## 2021-09-20 DIAGNOSIS — Z86010 Personal history of colonic polyps: Secondary | ICD-10-CM

## 2021-09-20 NOTE — Progress Notes (Signed)
Chief Complaint: Right lower quadrant pain  HPI: Joan Rodriguez is a 26 year old female with a past medical history of anxiety, depression, PTSD presents for follow up of right lower quadrant pain.    Interval History: Her abdominal pain has improved significantly. She had only one very mild episode of RLQ ab pain a few days ago but otherwise has not had any abdominal pain episodes since her last visit. She has been eating well.  Outpatient Medications Prior to Visit  Medication Sig Dispense Refill   hyoscyamine (LEVSIN SL) 0.125 MG SL tablet Take 1-2 tablets by mouth every 4-6 hours as needed for pain (Patient not taking: Reported on 09/20/2021) 30 tablet 2   No facility-administered medications prior to visit.    Physical Exam:  Vital signs: BP 118/68   Pulse 64   Ht '4\' 11"'$  (1.499 m)   Wt 160 lb (72.6 kg)   SpO2 97%   BMI 32.32 kg/m   Constitutional:   Pleasant Caucasian female, alert and cooperative HEENT:  Normocephalic and atraumatic. No icterus.  Respiratory: Lungs clear to auscultation bilaterally.   No wheezes, crackles, or rhonchi.  Cardiovascular: Normal S1, S2. Regular rate and rhythm.  Gastrointestinal:  Soft, nondistended, nontender.  Msk:  Symmetrical without gross deformities.  Neurologic:  Alert and  oriented x4;  grossly normal neurologically.  Skin:   Dry and intact without significant lesions or rashes. Psychiatric: Normal affect  RELEVANT LABS AND IMAGING: CBC    Component Value Date/Time   WBC 9.3 07/20/2021 0801   RBC 4.61 07/20/2021 0801   HGB 14.2 07/20/2021 0801   HCT 43.3 07/20/2021 0801   PLT 296 07/20/2021 0801   MCV 93.9 07/20/2021 0801   MCH 30.8 07/20/2021 0801   MCHC 32.8 07/20/2021 0801   RDW 13.2 07/20/2021 0801   LYMPHSABS 2.2 10/31/2020 2145   MONOABS 0.8 10/31/2020 2145   EOSABS 0.0 10/31/2020 2145   BASOSABS 0.1 10/31/2020 2145    CMP     Component Value Date/Time   NA 139 07/20/2021 0801   K 3.6 07/20/2021 0801   CL 106  07/20/2021 0801   CO2 25 07/20/2021 0801   GLUCOSE 101 (H) 07/20/2021 0801   BUN 7 07/20/2021 0801   CREATININE 0.66 07/20/2021 0801   CALCIUM 9.0 07/20/2021 0801   PROT 6.7 07/20/2021 0801   ALBUMIN 3.8 07/20/2021 0801   AST 18 07/20/2021 0801   ALT 18 07/20/2021 0801   ALKPHOS 67 07/20/2021 0801   BILITOT 0.4 07/20/2021 0801   GFRNONAA >60 07/20/2021 0801   GFRAA >60 07/28/2019 1404   CT A/P w/contrast 07/20/21: IMPRESSION: 1. No acute intra-abdominal process. 2. Diffuse submucosal fat deposition throughout the colon, usually sequelae of prior inflammation or infection. Correlate for history of inflammatory bowel disease.  Colonoscopy 08/16/21: - The terminal ileum appeared normal. - A 10 mm polyp was found in the sigmoid colon. The polyp was sessile. The polyp was removed with a cold snare. Resection and retrieval were complete. - Biopsies were taken with a cold forceps in the entire colon for histology. - Non-bleeding internal hemorrhoids were found during retroflexion. Path: 1. Surgical [P], colon nos, random sites - COLONIC MUCOSA WITH NO SPECIFIC PATHOLOGIC DIAGNOSIS. - NEGATIVE FOR ACTIVE COLITIS AND PATHOLOGIC INTRAEPITHELIAL LYMPHOCYTOSIS. 2. Surgical [P], colon, sigmoid, polyp (1) - TUBULAR ADENOMA, NEGATIVE FOR HIGH-GRADE DYSPLASIA.  A/P: Right lower quadrant pain History of colon polyps Patient has been doing much better. Ab pain has significantly improved. Her last colonoscopy showed  that she has no signs of colonic inflammation. Thus I suspect that her previous CT scan is likely suggestive of a recent infection, which she is now recovering from. She was noted on her last colonoscopy to have a precancerous polyps so she will need polyp surveillance in the future - Recall for repeat colonoscopy in 08/2024 for polyp surveillance - RTC PRN  [name], MD [facility] 09/20/2021, 9:38 AM  I spent 30 minutes of time, including in depth chart review,  independent review of results as outlined above, communicating results with the patient directly, face-to-face time with the patient, coordinating care, and ordering studies and medications as appropriate, and documentation.

## 2021-09-20 NOTE — Patient Instructions (Signed)
If you are age 27 or older, your body mass index should be between 23-30. Your Body mass index is 32.32 kg/m. If this is out of the aforementioned range listed, please consider follow up with your Primary Care Provider.  If you are age 3 or younger, your body mass index should be between 19-25. Your Body mass index is 32.32 kg/m. If this is out of the aformentioned range listed, please consider follow up with your Primary Care Provider.  Follow up as needed  The [facility] providers would like to encourage you to use [facility] to communicate with providers for non-urgent requests or questions.  Due to long hold times on the telephone, sending your provider a message by [facility] may be a faster and more efficient way to get a response.  Please allow 48 business hours for a response.  Please remember that this is for non-urgent requests.   Glad you are [place] better !  Thank you for entrusting me with your care and for choosing [facility], Dr. [name]

## 2022-03-06 ENCOUNTER — Ambulatory Visit (HOSPITAL_COMMUNITY)
Admission: EM | Admit: 2022-03-06 | Discharge: 2022-03-06 | Disposition: A | Discharge status: Home / Self Care | Payer: Medicaid Other | Attending: Internal Medicine | Admitting: Internal Medicine

## 2022-03-06 ENCOUNTER — Encounter (HOSPITAL_COMMUNITY): Payer: Self-pay

## 2022-03-06 DIAGNOSIS — H1032 Unspecified acute conjunctivitis, left eye: Secondary | ICD-10-CM

## 2022-03-06 MED ORDER — ERYTHROMYCIN 5 MG/GM OP OINT: TOPICAL_OINTMENT | Freq: Two times a day (BID) | OPHTHALMIC | 0 refills | Status: AC

## 2022-03-06 NOTE — ED Provider Notes (Signed)
[place]    CSN: [id number] Arrival date & time: 03/06/22  9326      History   Chief Complaint Chief Complaint  Patient presents with   Eye Drainage    HPI Joan Rodriguez is a 27 y.o. female comes to urgent care with a 1 day history of left eye redness with purulent drainage.  Patient woke up this morning with redness of her left eye and matting of her eyelids.  She used the wet rag to wipe the eyelids and for a brief moment her vision was pixelated.  She denies any double vision or blurry vision.  No nausea or vomiting.  No sore throat or nasal congestion.  No sick contacts.  Patient denies any itchy eyes.  She denies any contact lens use.   HPI  Past Medical History:  Diagnosis Date   ADHD (attention deficit hyperactivity disorder)    Anxiety    Asthma    Depressed    Depression    Oppositional defiant disorder    Polysubstance use disorder    PTSD (post-traumatic stress disorder)     Patient Active Problem List   Diagnosis Date Noted   Major depression, recurrent ([place]) 01/07/2019   Abnormal uterine bleeding (AUB) 11/26/2013   BV (bacterial vaginosis) 11/26/2013   Polysubstance dependence ([place]) 08/14/2011   Oppositional defiant disorder 08/14/2011   MDD (major depressive disorder) 08/12/2011   ADHD (attention deficit hyperactivity disorder), combined type 08/12/2011    Past Surgical History:  Procedure Laterality Date   NO PAST SURGERIES      OB History     Gravida  1   Para  0   Term  0   Preterm  0   AB  1   Living  0      SAB  1   IAB  0   Ectopic  0   Multiple  0   Live Births               Home Medications    Prior to Admission medications   Medication Sig Start Date End Date Taking? Authorizing Provider  erythromycin ophthalmic ointment Place into both eyes in the morning and at bedtime for 5 days. Place a 1/2 inch ribbon of ointment into the lower eyelid. 03/06/22 03/11/22 Yes Minetta Krisher, Myrene Galas, MD    Family  History Family History  Problem Relation Age of Onset   Sarcoidosis Mother    Hypertension Mother    Depression Mother    Anxiety disorder Mother    Prostate cancer Father    Cancer Sister    High Cholesterol Maternal Grandmother    Cancer Paternal Grandmother        lung?   Stomach cancer Neg Hx    Esophageal cancer Neg Hx    Colon cancer Neg Hx     Social History Social History   Tobacco Use   Smoking status: Former    Packs/day: 0.20    Types: Cigarettes    Quit date: 07/05/2011    Years since quitting: 10.6   Smokeless tobacco: Never  Vaping Use   Vaping Use: Never used  Substance Use Topics   Alcohol use: No   Drug use: Yes    Frequency: 35.0 times per week    Types: Marijuana    Comment: every day     Allergies   Patient has no known allergies.   Review of Systems Review of Systems As per HPI  Physical Exam  Triage Vital Signs ED Triage Vitals  Enc Vitals Group     BP 03/06/22 1018 114/66     Pulse Rate 03/06/22 1018 84     Resp 03/06/22 1018 18     Temp 03/06/22 1018 98.2 F (36.8 C)     Temp Source 03/06/22 1018 Oral     SpO2 03/06/22 1018 100 %     Weight --      Height --      Head Circumference --      Peak Flow --      Pain Score 03/06/22 1020 0     Pain Loc --      Pain Edu? --      Excl. in [place]? --    No data found.  Updated Vital Signs BP 114/66 (BP Location: Right Arm)   Pulse 84   Temp 98.2 F (36.8 C) (Oral)   Resp 18   LMP 02/21/2022 (Exact Date)   SpO2 100%   Visual Acuity Right Eye Distance:   Left Eye Distance:   Bilateral Distance:    Right Eye Near:   Left Eye Near:    Bilateral Near:     Physical Exam Vitals and nursing note reviewed.  Constitutional:      General: She is not in acute distress.    Appearance: Normal appearance. She is not ill-appearing.  HENT:     Right Ear: Tympanic membrane normal.     Left Ear: Tympanic membrane normal.     Nose: Nose normal.     Mouth/Throat:     Mouth: Mucous  membranes are moist.     Pharynx: No posterior oropharyngeal erythema.  Eyes:     Pupils: Pupils are equal, round, and reactive to light.     Comments: Conjunctival erythema of the left eye.  No eyelid swelling.  EOMI.  PERRLA.  Cardiovascular:     Rate and Rhythm: Normal rate and regular rhythm.  Neurological:     Mental Status: She is alert.      UC Treatments / Results  Labs (all labs ordered are listed, but only abnormal results are displayed) Labs Reviewed - No data to display  EKG   Radiology No results found.  Procedures Procedures (including critical care time)  Medications Ordered in UC Medications - No data to display  Initial Impression / Assessment and Plan / UC Course  I have reviewed the triage vital signs and the nursing notes.  Pertinent labs & imaging results that were available during my care of the patient were reviewed by me and considered in my medical decision making (see chart for details).     1.  Acute bacterial conjunctivitis: Erythromycin eye ointment Cleaning eye discharge with warm wet rag Return to urgent care if symptoms worsen Tylenol or Motrin as needed for pain and/or fever Final Clinical Impressions(s) / UC Diagnoses   Final diagnoses:  Acute bacterial conjunctivitis of left eye     Discharge Instructions      Please use medications as prescribed Clean eye discharge with a wet rag If you have worsening eye redness, pain, double vision, blurry vision-please return to urgent care to be reevaluated.   ED Prescriptions     Medication Sig Dispense Auth. Provider   erythromycin ophthalmic ointment Place into both eyes in the morning and at bedtime for 5 days. Place a 1/2 inch ribbon of ointment into the lower eyelid. 3.5 g Danielle Mink, Myrene Galas, MD      PDMP  not reviewed this encounter.   Chase Picket, MD 03/06/22 1122

## 2022-03-06 NOTE — ED Triage Notes (Signed)
Patient with c/o left eye drainage and redness. States she woke up with it crusty and stuck shut. States it started about 3 days ago.

## 2022-03-06 NOTE — Discharge Instructions (Addendum)
Please use medications as prescribed Clean eye discharge with a wet rag If you have worsening eye redness, pain, double vision, blurry vision-please return to urgent care to be reevaluated.

## 2022-04-08 ENCOUNTER — Emergency Department (HOSPITAL_COMMUNITY)
Admission: EM | Admit: 2022-04-08 | Discharge: 2022-04-08 | Disposition: A | Discharge status: Home / Self Care | Payer: Self-pay | Attending: Emergency Medicine | Admitting: Emergency Medicine

## 2022-04-08 ENCOUNTER — Encounter (HOSPITAL_COMMUNITY): Payer: Self-pay | Admitting: Emergency Medicine

## 2022-04-08 ENCOUNTER — Other Ambulatory Visit: Payer: Self-pay

## 2022-04-08 DIAGNOSIS — J45909 Unspecified asthma, uncomplicated: Secondary | ICD-10-CM | POA: Insufficient documentation

## 2022-04-08 DIAGNOSIS — N644 Mastodynia: Secondary | ICD-10-CM | POA: Insufficient documentation

## 2022-04-08 LAB — COMPREHENSIVE METABOLIC PANEL
ALT: 25 U/L (ref 0–44)
AST: 26 U/L (ref 15–41)
Albumin: 3.9 g/dL (ref 3.5–5.0)
Alkaline Phosphatase: 62 U/L (ref 38–126)
Anion gap: 10 (ref 5–15)
BUN: 8 mg/dL (ref 6–20)
CO2: 25 mmol/L (ref 22–32)
Calcium: 9.2 mg/dL (ref 8.9–10.3)
Chloride: 102 mmol/L (ref 98–111)
Creatinine, Ser: 0.73 mg/dL (ref 0.44–1.00)
GFR, Estimated: >60 mL/min (ref >60)
Glucose, Bld: 83 mg/dL (ref 70–99)
Potassium: 4.1 mmol/L (ref 3.5–5.1)
Sodium: 137 mmol/L (ref 135–145)
Total Bilirubin: 0.4 mg/dL (ref 0.3–1.2)
Total Protein: 6.9 g/dL (ref 6.5–8.1)

## 2022-04-08 LAB — CBC WITH DIFFERENTIAL/PLATELET
Abs Immature Granulocytes: 0.02 10*3/uL (ref 0.00–0.07)
Basophils Absolute: 0 10*3/uL (ref 0.0–0.1)
Basophils Relative: 1 %
Eosinophils Absolute: 0.1 10*3/uL (ref 0.0–0.5)
Eosinophils Relative: 1 %
HCT: 43.1 % (ref 36.0–46.0)
Hemoglobin: 14.8 g/dL (ref 12.0–15.0)
Immature Granulocytes: 0 %
Lymphocytes Relative: 34 %
Lymphs Abs: 2.8 10*3/uL (ref 0.7–4.0)
MCH: 31.6 pg (ref 26.0–34.0)
MCHC: 34.3 g/dL (ref 30.0–36.0)
MCV: 92.1 fL (ref 80.0–100.0)
Monocytes Absolute: 0.8 10*3/uL (ref 0.1–1.0)
Monocytes Relative: 10 %
Neutro Abs: 4.5 10*3/uL (ref 1.7–7.7)
Neutrophils Relative %: 54 %
Platelets: 313 10*3/uL (ref 150–400)
RBC: 4.68 MIL/uL (ref 3.87–5.11)
RDW: 12.9 % (ref 11.5–15.5)
WBC: 8.3 10*3/uL (ref 4.0–10.5)
nRBC: 0 % (ref 0.0–0.2)

## 2022-04-08 LAB — I-STAT BETA HCG BLOOD, ED (MC, WL, AP ONLY): I-stat hCG, quantitative: <5 m[IU]/mL (ref <5)

## 2022-04-08 NOTE — ED Provider Notes (Signed)
[facility] Provider Note   CSN: [id number] Arrival date & time: 04/08/22  2536     History  Chief Complaint  Patient presents with   Breast Pain    Joan Rodriguez is a 27 y.o. female with history of ADHD, depression, anxiety, oppositional defiant disorder, PTSD, polysubstance abuse, asthma who presents the emergency department complaining of right breast pain and redness for the past week.  Also believes that she had a fever yesterday.  States that she had both of her nipples pierced several years ago, and they got infected.  Ever since then she has intermittent purulent discharge from both of her nipples.  Has noticed some since this pain started, but no more than it has been.  Says that she never saw a doctor for this. She is not breastfeeding.  HPI     Home Medications Prior to Admission medications   Not on File      Allergies    Patient has no known allergies.    Review of Systems   Review of Systems  Constitutional:  Positive for fever.  Skin:        Breast pain  All other systems reviewed and are negative.   Physical Exam Updated Vital Signs BP 111/73   Pulse 68   Temp 98.7 F (37.1 C) (Oral)   Resp 18   SpO2 100%  Physical Exam Vitals and nursing note reviewed.  Constitutional:      Appearance: Normal appearance.  HENT:     Head: Normocephalic and atraumatic.  Eyes:     Conjunctiva/sclera: Conjunctivae normal.  Pulmonary:     Effort: Pulmonary effort is normal. No respiratory distress.  Chest:  Breasts:    Right: Tenderness present. No swelling, bleeding, inverted nipple, mass, nipple discharge or skin change.     Left: Normal.     Comments: Tenderness to palpation across the right breast including 11 and 12 o'clock positions, as well as 5 and 6 o'clock. No overlying skin changes or nipple changes. Tenderness in the R axilla without discriminatory adenopathy. No visualized or palpable abscess. Skin:     General: Skin is warm and dry.  Neurological:     Mental Status: She is alert.  Psychiatric:        Mood and Affect: Mood normal.        Behavior: Behavior normal.     ED Results / Procedures / Treatments   Labs (all labs ordered are listed, but only abnormal results are displayed) Labs Reviewed  COMPREHENSIVE METABOLIC PANEL  CBC WITH DIFFERENTIAL/PLATELET  I-STAT BETA HCG BLOOD, ED ([facility], [facility], [facility] ONLY)    EKG None  Radiology No results found.  Procedures Procedures    Medications Ordered in ED Medications - No data to display  ED Course/ Medical Decision Making/ A&P                             Medical Decision Making Amount and/or Complexity of Data Reviewed Labs: ordered.  This patient is a 27 y.o. female  who presents to the ED for concern of R breast pain x 1 week.   Differential diagnoses prior to evaluation: The emergent differential diagnosis includes, but is not limited to, non-lactational mastitis, breast abscess, malignancy. This is not an exhaustive differential.   Past Medical History / Co-morbidities: ADHD, depression, anxiety, oppositional defiant disorder, PTSD, polysubstance abuse, asthma  Physical Exam: Physical exam  performed. The pertinent findings include: Normal vital signs, afebrile.  Right breast tenderness noted to several different positions without specific focal tenderness or abscess noted. Right axillary tenderness without definitive adenopathy. No overlying skin changes, no nipple changes.   Lab Tests/Imaging studies: I personally interpreted labs/imaging and the pertinent results include:  CBC without leukocytosis, CMP unremarkable.  Negative pregnancy.  Disposition: After consideration of the diagnostic results and the patients response to treatment, I feel that emergency department workup does not suggest an emergent condition requiring admission or immediate intervention beyond what has been performed at this time. The plan is:  discharge to home with SW consult for assistance with establishing with PCP, and hopefully obtain mammogram. No definitive abscess or skin infection today, thus do not believe antibiotics would be beneficial at this time. The patient is safe for discharge and has been instructed to return immediately for worsening symptoms, change in symptoms or any other concerns.  Final Clinical Impression(s) / ED Diagnoses Final diagnoses:  Breast pain, right    Rx / DC Orders ED Discharge Orders     None      Portions of this report may have been transcribed using voice recognition software. Every effort was made to ensure accuracy; however, inadvertent computerized transcription errors may be present.    Joan Rodriguez 04/08/22 1109    Joan Dessert, MD 04/09/22 1118

## 2022-04-08 NOTE — ED Triage Notes (Signed)
Pt reports right breast pain and redness x 1 week. Pt also reports a fever Thursday.

## 2022-04-08 NOTE — Discharge Instructions (Signed)
You were seen in the emergency department today for breast pain.  As we discussed your lab work looked reassuring today.  I am not sure if you have an infection in the breast or if there could be something else underlying.  It is very important that you establish with a primary doctor to have a mammogram.  Have attached the contact information on your paperwork for [facility] community health and wellness.  Please give them a call on Monday to make an appointment.  They can refer you for mammogram, and they also have financial counseling/aid.  Continue to monitor how you are doing and return to the emergency department for any new or worsening symptoms such as persistent fever, redness or swelling of the breast, or any worsening nipple discharge.  You can continue taking ibuprofen or Tylenol as needed for pain.

## 2022-04-08 NOTE — Care Management (Signed)
PCP information placed on AVS,  they would refer to mammography.? The patient can check different places Monday to see if they need a referral.

## 2022-04-10 ENCOUNTER — Ambulatory Visit: Payer: Self-pay

## 2022-04-10 NOTE — Telephone Encounter (Signed)
Chief Complaint: Right breast pain Symptoms: pain to right breast that's spread to under armpit down arm, nipple discharge at times (ongoing off and on x 3-4 years), looks like a vein is swollen Frequency: Onset 1 week ato Pertinent Negatives: Patient denies redness to breast Disposition: '[]'$ ED /'[]'$ Urgent Care (no appt availability in office) / '[]'$ Appointment(In office/virtual)/ '[]'$  [facility]/ '[]'$ Home Care/ '[]'$ Refused Recommended Disposition /'[x]'$ [facility]/ '[]'$  Follow-up with PCP Additional Notes: Advised since no PCP to go to the [facility], she prefers it to be in [place], first available is on Thursday 04/20/22. Agent gave numbers for the community clinics for HFU appt, advised to go ahead and call those numbers today.   Reason for Disposition  [1] Breast pain AND [2] cause is not known  Answer Assessment - Initial Assessment Questions 1. SYMPTOM: "What's the main symptom you're concerned about?"  (e.g., lump, pain, rash, nipple discharge)     Pain and lump 2. LOCATION: "Where is the pain located?"     Top of right breast now down underneath breast and under armpit down right arm 3. ONSET: "When did pain start?"     1 week ago 4. PRIOR HISTORY: "Do you have any history of prior problems with your breasts?" (e.g., lumps, cancer, fibrocystic breast disease)     No 5. CAUSE: "What do you think is causing this symptom?"     Unknown 6. OTHER SYMPTOMS: "Do you have any other symptoms?" (e.g., fever, breast pain, redness or rash, nipple discharge)     Red and swollen at times, but not at this time; nipple discharge 3-4 years after infection from nipple piercing 7. PREGNANCY-BREASTFEEDING: "Is there any chance you are pregnant?" "When was your last menstrual period?" "Are you breastfeeding?"     No chance of pregnancy, LMP 03/22/22  Protocols used: Breast Symptoms-A-AH

## 2022-08-10 ENCOUNTER — Encounter (HOSPITAL_COMMUNITY): Payer: Self-pay

## 2022-08-10 ENCOUNTER — Emergency Department (HOSPITAL_COMMUNITY)
Admission: EM | Admit: 2022-08-10 | Discharge: 2022-08-10 | Disposition: A | Discharge status: Home / Self Care | Payer: Medicaid Other | Attending: Emergency Medicine | Admitting: Emergency Medicine

## 2022-08-10 ENCOUNTER — Other Ambulatory Visit: Payer: Self-pay

## 2022-08-10 ENCOUNTER — Emergency Department (HOSPITAL_COMMUNITY): Payer: Medicaid Other

## 2022-08-10 DIAGNOSIS — R251 Tremor, unspecified: Secondary | ICD-10-CM | POA: Insufficient documentation

## 2022-08-10 DIAGNOSIS — R55 Syncope and collapse: Secondary | ICD-10-CM | POA: Diagnosis present

## 2022-08-10 DIAGNOSIS — R4020 Unspecified coma: Secondary | ICD-10-CM

## 2022-08-10 DIAGNOSIS — R569 Unspecified convulsions: Secondary | ICD-10-CM | POA: Diagnosis not present

## 2022-08-10 DIAGNOSIS — M25572 Pain in left ankle and joints of left foot: Secondary | ICD-10-CM | POA: Diagnosis not present

## 2022-08-10 DIAGNOSIS — R519 Headache, unspecified: Secondary | ICD-10-CM | POA: Insufficient documentation

## 2022-08-10 DIAGNOSIS — N3000 Acute cystitis without hematuria: Secondary | ICD-10-CM | POA: Insufficient documentation

## 2022-08-10 LAB — URINALYSIS, ROUTINE W REFLEX MICROSCOPIC
Bilirubin Urine: NEGATIVE
Glucose, UA: NEGATIVE mg/dL
Hgb urine dipstick: NEGATIVE
Ketones, ur: 80 mg/dL — AB
Nitrite: NEGATIVE
Protein, ur: NEGATIVE mg/dL
Specific Gravity, Urine: 1.024 (ref 1.005–1.030)
pH: 5 (ref 5.0–8.0)

## 2022-08-10 LAB — CBG MONITORING, ED: Glucose-Capillary: 70 mg/dL (ref 70–99)

## 2022-08-10 LAB — CBC
HCT: 44 % (ref 36.0–46.0)
Hemoglobin: 14.4 g/dL (ref 12.0–15.0)
MCH: 30.4 pg (ref 26.0–34.0)
MCHC: 32.7 g/dL (ref 30.0–36.0)
MCV: 92.8 fL (ref 80.0–100.0)
Platelets: 347 10*3/uL (ref 150–400)
RBC: 4.74 MIL/uL (ref 3.87–5.11)
RDW: 13.1 % (ref 11.5–15.5)
WBC: 9.3 10*3/uL (ref 4.0–10.5)
nRBC: 0 % (ref 0.0–0.2)

## 2022-08-10 LAB — BASIC METABOLIC PANEL
Anion gap: 10 (ref 5–15)
BUN: 8 mg/dL (ref 6–20)
CO2: 21 mmol/L — ABNORMAL LOW (ref 22–32)
Calcium: 8.9 mg/dL (ref 8.9–10.3)
Chloride: 105 mmol/L (ref 98–111)
Creatinine, Ser: 0.73 mg/dL (ref 0.44–1.00)
GFR, Estimated: >60 mL/min (ref >60)
Glucose, Bld: 70 mg/dL (ref 70–99)
Potassium: 3.8 mmol/L (ref 3.5–5.1)
Sodium: 136 mmol/L (ref 135–145)

## 2022-08-10 LAB — I-STAT BETA HCG BLOOD, ED (MC, WL, AP ONLY): I-stat hCG, quantitative: <5 m[IU]/mL (ref <5)

## 2022-08-10 MED ORDER — NITROFURANTOIN MONOHYD MACRO 100 MG PO CAPS: 100.0000 mg | ORAL_CAPSULE | Freq: Two times a day (BID) | ORAL | 0 refills | Status: DC

## 2022-08-10 NOTE — ED Provider Notes (Signed)
[place] EMERGENCY DEPARTMENT AT [facility] Provider Note   CSN: [id number] Arrival date & time: 08/10/22  1005     History  Chief Complaint  Patient presents with   Loss of Consciousness   Seizures   Chest Pain   Ankle Pain    Joan Rodriguez is a 27 y.o. female.  Patient with no significant past medical history presents to the emergency department today for evaluation of loss of consciousness.  Patient awoke this morning in her normal state of health.  While she was at work she suddenly began to feel lightheaded.  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  She passed out and lost consciousness, for which she reports per bystanders, was 10 to 15 minutes.  She states that she was told that she had seizure-like activity described as shaking.  Patient awoke and a family member came and picked her up and brought her to the hospital.  She does not have any history of seizures.  She has a mild headache.  She twisted her left ankle and has pain and difficulty weightbearing after the fall.  No weakness, numbness, or tingling in her extremities.  Denies alcohol or drug use.       Home Medications Prior to Admission medications   Not on File      Allergies    Patient has no known allergies.    Review of Systems   Review of Systems  Physical Exam Updated Vital Signs BP 127/78 (BP Location: Right Arm)   Pulse 76   Temp 97.7 F (36.5 C) (Oral)   Resp 18   Ht 4\' 11"  (1.499 m)   Wt 81.6 kg   LMP 07/20/2022   SpO2 100%   BMI 36.36 kg/m   Physical Exam Vitals and nursing note reviewed.  Constitutional:      Appearance: She is well-developed.  HENT:     Head: Normocephalic and atraumatic.     Right Ear: Tympanic membrane, ear canal and external ear normal.     Left Ear: Tympanic membrane, ear canal and external ear normal.     Nose: Nose normal.     Mouth/Throat:     Pharynx: Uvula midline.  Eyes:     General: Lids are normal.     Extraocular  Movements:     Right eye: No nystagmus.     Left eye: No nystagmus.     Conjunctiva/sclera: Conjunctivae normal.     Pupils: Pupils are equal, round, and reactive to light.  Cardiovascular:     Rate and Rhythm: Normal rate and regular rhythm.  Pulmonary:     Effort: Pulmonary effort is normal.     Breath sounds: Normal breath sounds.  Abdominal:     Palpations: Abdomen is soft.     Tenderness: There is no abdominal tenderness.  Musculoskeletal:     Cervical back: Normal range of motion and neck supple. No tenderness or bony tenderness.     Left lower leg: No swelling or tenderness. No edema.     Left ankle: Tenderness present over the lateral malleolus. Decreased range of motion.  Skin:    General: Skin is warm and dry.  Neurological:     Mental Status: She is alert and oriented to person, place, and time.     GCS: GCS eye subscore is 4. GCS verbal subscore is 5. GCS motor subscore is 6.     Cranial Nerves: No cranial nerve deficit.  Sensory: No sensory deficit.     Motor: No weakness.     Coordination: Coordination normal.     Gait: Gait normal.     Comments: Upper extremity myotomes tested bilaterally:  C5 Shoulder abduction 5/5 C6 Elbow flexion/wrist extension 5/5 C7 Elbow extension 5/5 C8 Finger flexion 5/5 T1 Finger abduction 5/5  Lower extremity myotomes tested bilaterally: L2 Hip flexion 5/5 L3 Knee extension 5/5 L4 Ankle dorsiflexion 5/5 S1 Ankle plantar flexion 5/5      ED Results / Procedures / Treatments   Labs (all labs ordered are listed, but only abnormal results are displayed) Labs Reviewed  BASIC METABOLIC PANEL - Abnormal; Notable for the following components:      Result Value   CO2 21 (*)    All other components within normal limits  URINALYSIS, ROUTINE W REFLEX MICROSCOPIC - Abnormal; Notable for the following components:   APPearance CLOUDY (*)    Ketones, ur 80 (*)    Leukocytes,Ua MODERATE (*)    Bacteria, UA FEW (*)    All other  components within normal limits  CBC  CBG MONITORING, ED  I-STAT BETA HCG BLOOD, ED ([facility], [facility], [facility] ONLY)    EKG EKG Interpretation  Date/Time:  Thursday August 10 2022 10:24:50 EDT Ventricular Rate:  81 PR Interval:  146 QRS Duration: 74 QT Interval:  386 QTC Calculation: 448 R Axis:   76 Text Interpretation: Normal sinus rhythm Normal ECG No significant change since last tracing Confirmed by Melene Plan [phone]) on 08/10/2022 11:56:19 AM  Radiology CT HEAD WO CONTRAST ( )  Result Date: 08/10/2022 CLINICAL DATA:  Seizure, new-onset, no history of trauma question seizure vs syncope spell today EXAM: CT HEAD WITHOUT CONTRAST TECHNIQUE: Contiguous axial images were obtained from the base of the skull through the vertex without intravenous contrast. RADIATION DOSE REDUCTION: This exam was performed according to the departmental dose-optimization program which includes automated exposure control, adjustment of the mA and/or kV according to patient size and/or use of iterative reconstruction technique. COMPARISON:  None Available. FINDINGS: Brain: No evidence of acute infarction, hemorrhage, hydrocephalus, extra-axial collection or mass lesion/mass effect. Vascular: No hyperdense vessel or unexpected calcification. Skull: Normal. Negative for fracture or focal lesion. Sinuses/Orbits: No middle ear or mastoid effusion. Paranasal sinuses clear. Orbits are unremarkable. Other: None. IMPRESSION: No acute intracranial abnormality. No specific etiology for seizures identified. Electronically Signed   By: Lorenza Cambridge M.D.   On: 08/10/2022 15:05   DG Ankle Complete Right  Result Date: 08/10/2022 CLINICAL DATA:  Ankle pain after a fall today. EXAM: RIGHT ANKLE - COMPLETE 3+ VIEW COMPARISON:  None Available. FINDINGS: There is no evidence of fracture, dislocation, or joint effusion. There is no evidence of arthropathy or other focal bone abnormality. Soft tissues are unremarkable. IMPRESSION: Negative.  Electronically Signed   By: Emmaline Kluver M.D.   On: 08/10/2022 13:22    Procedures Procedures    Medications Ordered in ED Medications - No data to display  ED Course/ Medical Decision Making/ A&P    Patient seen and examined. History obtained directly from patient. Work-up including labs, imaging, EKG ordered in triage, if performed, were reviewed.    Labs/EKG: Independently reviewed and interpreted.  This included: CBC unremarkable; BMP pending; pregnancy negative; UA with white cells but not a clean-catch, does have 80 ketones without glucose.  Imaging: Ordered CT of the head and x-ray of the left ankle  Medications/Fluids: None ordered  Most recent vital signs reviewed and are as follows:  BP 127/78 (BP Location: Right Arm)   Pulse 76   Temp 97.7 F (36.5 C) (Oral)   Resp 18   Ht 4\' 11"  (1.499 m)   Wt 81.6 kg   LMP 07/20/2022   SpO2 100%   BMI 36.36 kg/m   Initial impression: Patient has not had any recent nausea or vomiting.  Will have her drink some fluids here in the emergency room given the ketones noted in her urine.  Will complete workup.  Discussed that she may need to have outpatient follow-up with neurology given concern for seizure activity.  She states that she does not drive.  3:16 PM Reassessment performed. Patient appears stable.  She is requesting an antibiotic as she has had a "bleach" odor to her urine and feels that she has a UTI.  Will send in Macrobid.  Labs personally reviewed and interpreted including: CBC unremarkable; BMP unremarkable; pregnancy negative.  Imaging personally visualized and interpreted including: CT head and x-ray of the ankle, agree both are negative.  Orthostatic VS for the past 24 hrs:  BP- Lying Pulse- Lying BP- Sitting Pulse- Sitting BP- Standing at 0 minutes Pulse- Standing at 0 minutes  08/10/22 1253 117/85 79 117/81 78 (!) 121/96 117    Reviewed pertinent lab work and imaging with patient at bedside. Questions  answered.   Most current vital signs reviewed and are as follows: BP 123/79   Pulse 80   Temp 98.2 F (36.8 C) (Oral)   Resp 16   Ht 4\' 11"  (1.499 m)   Wt 81.6 kg   LMP 07/20/2022   SpO2 99%   BMI 36.36 kg/m   Plan: Discharge to home.   Prescriptions written for: Macrobid  Other home care instructions discussed: Encouraged increasing hydration due to ketones and tachycardia with standing. Discussed possible seizure precautions with patient including no driving however she does not have a driver's license.  ED return instructions discussed: Additional episodes of syncope or seizure-like activity, new symptoms or other concerns.    Follow-up instructions discussed: Patient encouraged to follow-up with their PCP and neuro in 1 week.                              Medical Decision Making Amount and/or Complexity of Data Reviewed Labs: ordered. Radiology: ordered.  Risk Prescription drug management.   Loss of consciousness/seizure-like activity: Unclear if this represents a true seizure episode or syncope.  Head CT was negative.  Labs are normal.  No underlying substance abuse reported.  She has remained stable in the ED.  She does have tachycardia with standing and a concentrated urine.  Encouraged increased hydration at home.  Ankle pain after fall: Negative x-ray, continue RICE protocol and symptomatic treatment.  Question of UTI: Patient thinks that she has UTI given foul odor to the urine, will treat with 5-day course of Macrobid.        Final Clinical Impression(s) / ED Diagnoses Final diagnoses:  Loss of consciousness (HCC)  Acute left ankle pain  Acute cystitis without hematuria    Rx / DC Orders ED Discharge Orders          Ordered    Ambulatory referral to Neurology       Comments: An appointment is requested in approximately: 1 week for syncope vs seizure   08/10/22 1503    nitrofurantoin, macrocrystal-monohydrate, (MACROBID) 100 MG capsule  2 times  daily  08/10/22 1514              Renne Crigler, PA-C 08/10/22 1522    Melene Plan, DO 08/10/22 1523

## 2022-08-10 NOTE — Discharge Instructions (Addendum)
Please read and follow all provided instructions.  Your diagnoses today include:  1. Loss of consciousness (HCC)   2. Acute left ankle pain     Tests performed today include: CT scan of your head that did not show any serious injury X-ray of your ankle - no fracture of broken bone Complete blood cell count Basic metabolic panel Urine test Vital signs. See below for your results today.   Medications prescribed:  Macrobid - antibiotic for urinary tract infection  You have been prescribed an antibiotic medicine: take the entire course of medicine even if you are feeling better. Stopping early can cause the antibiotic not to work.  Take any prescribed medications only as directed.  Home care instructions:  Follow any educational materials contained in this packet.  As we discussed, do not drive or perform other dangerous activities that could lead to a risk if you were to pass out or fall.  Follow-up instructions: Please follow-up with neurology in 1 week.  I have placed a referral on your behalf.  Return instructions:  SEEK IMMEDIATE MEDICAL ATTENTION IF: There is confusion or drowsiness (although children frequently become drowsy after injury).  You cannot awaken the injured person.  You have more than one episode of vomiting.  You notice dizziness or unsteadiness which is getting worse, or inability to walk.  You have convulsions or unconsciousness.  You experience severe, persistent headaches not relieved by Tylenol. You cannot use arms or legs normally.  There are changes in pupil sizes. (This is the black center in the colored part of the eye)  There is clear or bloody discharge from the nose or ears.  You have change in speech, vision, swallowing, or understanding.  Localized weakness, numbness, tingling, or change in bowel or bladder control. You have any other emergent concerns.  Your vital signs today were: BP 123/79   Pulse 80   Temp 98.2 F (36.8 C) (Oral)    Resp 16   Ht 4\' 11"  (1.499 m)   Wt 81.6 kg   LMP 07/20/2022   SpO2 99%   BMI 36.36 kg/m  If your blood pressure (BP) was elevated above 135/85 this visit, please have this repeated by your doctor within one month. --------------

## 2022-08-10 NOTE — ED Triage Notes (Addendum)
Pt. 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 Ive never had a seizure before.My chest has hurt since I fell and my left ankle I guess I hurt when I fell Pt Alert and oriented x 3 . VAN neg

## 2022-08-16 ENCOUNTER — Ambulatory Visit (INDEPENDENT_AMBULATORY_CARE_PROVIDER_SITE_OTHER): Payer: Medicaid Other | Admitting: Neurology

## 2022-08-16 ENCOUNTER — Encounter: Payer: Self-pay | Admitting: Neurology

## 2022-08-16 VITALS — BP 114/72 | HR 89 | Ht 59.0 in | Wt 184.5 lb

## 2022-08-16 DIAGNOSIS — R55 Syncope and collapse: Secondary | ICD-10-CM

## 2022-08-16 DIAGNOSIS — R569 Unspecified convulsions: Secondary | ICD-10-CM

## 2022-08-16 NOTE — Progress Notes (Signed)
[facility]  PATIENT: Joan Rodriguez DOB: March 02, 1996  REQUESTING CLINICIAN: Renne Crigler, PA-C HISTORY FROM: Patient  REASON FOR VISIT: Syncope vs. Seizure    HISTORICAL  CHIEF COMPLAINT:  Chief Complaint  Patient presents with   New Patient (Initial Visit)    Rm 12. Patient alone, states she was at work and had an episode where she passed out and her coworkers stated she looked like she was having a seizure and it last 10-15 minutes. Went to ER and was referred here after everything was cleared.     HISTORY OF PRESENT ILLNESS:  This is a 27 year old woman past medical history of anxiety/depression who is presenting after an event concerning for seizure versus syncope.  Patient reports in the morning on June 6, she felt fine, went to work, reports she started feeling queasy at work.  She was sitting down and then woke up on the floor with people around her.  She was told that she passed out and was shaking for about 10 minutes.  She was also told that she vomited.  She was taken to the ED.  Her head CT was normal.  She denies any tongue biting, no urinary incontinence but she did have an ankle sprain.  She does report that she had a history of syncope due to heat but no previous history of seizures.  She reports in the past she has waking up from sleep on the floor and some other times she woke up to blood on the pillow but she related to nosebleed because she has a history of nosebleeds.  Denies any other seizure risk factors such as head trauma or history of meningitis or encephalitis.  No family history of seizure.     Handedness: Right handed   Onset:6/6  Seizure Type: Convulsion, passing out  Current frequency: Only once  Any injuries from seizures: Ankle sprain   Seizure risk factors:  None reported   Previous ASMs: None   Currenty ASMs: None   ASMs side effects: N/A   Brain Images: Normal head CT   Previous EEGs:    OTHER MEDICAL  CONDITIONS: Immune disorder,   REVIEW OF SYSTEMS: Full 14 system review of systems performed and negative with exception of: As noted in the HPI  ALLERGIES: No Known Allergies  HOME MEDICATIONS: Outpatient Medications Prior to Visit  Medication Sig Dispense Refill   nitrofurantoin, macrocrystal-monohydrate, (MACROBID) 100 MG capsule Take 1 capsule (100 mg total) by mouth 2 (two) times daily. 10 capsule 0   No facility-administered medications prior to visit.    PAST MEDICAL HISTORY: Past Medical History:  Diagnosis Date   ADHD (attention deficit hyperactivity disorder)    Anxiety    Asthma    Depressed    Depression    Oppositional defiant disorder    Polysubstance use disorder    PTSD (post-traumatic stress disorder)     PAST SURGICAL HISTORY: Past Surgical History:  Procedure Laterality Date   NO PAST SURGERIES      FAMILY HISTORY: Family History  Problem Relation Age of Onset   Sarcoidosis Mother    Hypertension Mother    Depression Mother    Anxiety disorder Mother    Prostate cancer Father    Cancer Sister    High Cholesterol Maternal Grandmother    Cancer Paternal Grandmother        lung?   Stomach cancer Neg Hx    Esophageal cancer Neg Hx    Colon cancer Neg Hx  SOCIAL HISTORY: Social History   Socioeconomic History   Marital status: Single    Spouse name: Not on file   Number of children: 0   Years of education: Not on file   Highest education level: Not on file  Occupational History   Not on file  Tobacco Use   Smoking status: Former    Packs/day: .2    Types: Cigarettes    Quit date: 07/05/2011    Years since quitting: 11.1   Smokeless tobacco: Never  Vaping Use   Vaping Use: Never used  Substance and Sexual Activity   Alcohol use: No   Drug use: Yes    Frequency: 35.0 times per week    Types: Marijuana    Comment: every day   Sexual activity: Yes    Birth control/protection: None  Other Topics Concern   Not on file  Social  History Narrative   Not on file   Social Determinants of Health   Financial Resource Strain: Not on file  Food Insecurity: Not on file  Transportation Needs: Not on file  Physical Activity: Not on file  Stress: Not on file  Social Connections: Not on file  Intimate Partner Violence: Not on file     PHYSICAL EXAM   GENERAL EXAM/CONSTITUTIONAL: Vitals:  Vitals:   08/16/22 1509  BP: 114/72  Pulse: 89  Weight: 184 lb 8 oz (83.7 kg)  Height: 4\' 11"  (1.499 m)   Body mass index is 37.26 kg/m. Wt Readings from Last 3 Encounters:  08/16/22 184 lb 8 oz (83.7 kg)  08/10/22 180 lb (81.6 kg)  09/20/21 160 lb (72.6 kg)   Patient is in no distress; well developed, nourished and groomed; neck is supple  MUSCULOSKELETAL: Gait, strength, tone, movements noted in Neurologic exam below  NEUROLOGIC: MENTAL STATUS:      No data to display         awake, alert, oriented to person, place and time recent and remote memory intact normal attention and concentration language fluent, comprehension intact, naming intact fund of knowledge appropriate  CRANIAL NERVE:  2nd, 3rd, 4th, 6th - Visual fields full to confrontation, extraocular muscles intact, no nystagmus 5th - facial sensation symmetric 7th - facial strength symmetric 8th - hearing intact 9th - palate elevates symmetrically, uvula midline 11th - shoulder shrug symmetric 12th - tongue protrusion midline  MOTOR:  normal bulk and tone, full strength in the BUE, BLE  SENSORY:  normal and symmetric to light touch  COORDINATION:  finger-nose-finger, fine finger movements normal  GAIT/STATION:  normal   DIAGNOSTIC DATA (LABS, IMAGING, TESTING) - I reviewed patient records, labs, notes, testing and imaging myself where available.  Lab Results  Component Value Date   WBC 9.3 08/10/2022   HGB 14.4 08/10/2022   HCT 44.0 08/10/2022   MCV 92.8 08/10/2022   PLT 347 08/10/2022      Component Value Date/Time   NA  136 08/10/2022 1054   K 3.8 08/10/2022 1054   CL 105 08/10/2022 1054   CO2 21 (L) 08/10/2022 1054   GLUCOSE 70 08/10/2022 1054   BUN 8 08/10/2022 1054   CREATININE 0.73 08/10/2022 1054   CALCIUM 8.9 08/10/2022 1054   PROT 6.9 04/08/2022 0928   ALBUMIN 3.9 04/08/2022 0928   AST 26 04/08/2022 0928   ALT 25 04/08/2022 0928   ALKPHOS 62 04/08/2022 0928   BILITOT 0.4 04/08/2022 0928   GFRNONAA >60 08/10/2022 1054   GFRAA >60 07/28/2019 1404   Lab  Results  Component Value Date   CHOL 176 01/07/2019   HDL 72 01/07/2019   LDLCALC 93 01/07/2019   TRIG 54 01/07/2019   Lab Results  Component Value Date   HGBA1C 5.0 01/07/2019   No results found for: "VITAMINB12" Lab Results  Component Value Date   TSH 2.253 01/07/2019    Head CT 08/10/2022 No acute intracranial abnormality. No specific etiology for seizures identified.  I personally reviewed brain Images.   ASSESSMENT AND PLAN  27 y.o. year old female  with history of anxiety/depression who is presenting after seizure versus syncope.  Patient reports feeling queasy before the event.  Plan for now will include a routine EEG, I will contact patient to go over the result.  If normal we will continue to observe her and if she has another event she will contact me.  At that time we will consider ambulatory EEG.  If EEG is abnormal I will bring the patient back to discuss additional testing and medication if needed.  This was discussed with the patient and she is comfortable with plan.  Continue your current medications and follow-up as needed.   1. Seizure-like activity (HCC)   2. Syncope, unspecified syncope type     Patient Instructions  Routine EEG, I will contact you to go over the result Continue your other medications Please call us if you do have another event Driving restriction for next 6 months Follow-up as needed   Per [facility] statutes, patients with seizures are not allowed to drive until they have been  seizure-free for six months.  Other recommendations include using caution when using heavy equipment or power tools. Avoid working on ladders or at heights. Take showers instead of baths.  Do not swim alone.  Ensure the water temperature is not too high on the home water heater. Do not go swimming alone. Do not lock yourself in a room alone (i.e. bathroom). When caring for infants or small children, sit down when holding, feeding, or changing them to minimize risk of injury to the child in the event you have a seizure. Maintain good sleep hygiene. Avoid alcohol.  Also recommend adequate sleep, hydration, good diet and minimize stress.   During the Seizure  - First, ensure adequate ventilation and place patients on the floor on their left side  Loosen clothing around the neck and ensure the airway is patent. If the patient is clenching the teeth, do not force the mouth open with any object as this can cause severe damage - Remove all items from the surrounding that can be hazardous. The patient may be oblivious to what's happening and may not even know what he or she is doing. If the patient is confused and wandering, either gently guide him/her away and block access to outside areas - Reassure the individual and be comforting - Call 911. In most cases, the seizure ends before EMS arrives. However, there are cases when seizures may last over 3 to 5 minutes. Or the individual may have developed breathing difficulties or severe injuries. If a pregnant patient or a person with diabetes develops a seizure, it is prudent to call an ambulance. - Finally, if the patient does not regain full consciousness, then call EMS. Most patients will remain confused for about 45 to 90 minutes after a seizure, so you must use judgment in calling for help. - Avoid restraints but make sure the patient is in a bed with padded side rails - Place the individual  in a lateral position with the neck slightly flexed; this will help  the saliva drain from the mouth and prevent the tongue from falling backward - Remove all nearby furniture and other hazards from the area - Provide verbal assurance as the individual is regaining consciousness - Provide the patient with privacy if possible - Call for help and start treatment as ordered by the caregiver   After the Seizure (Postictal Stage)  After a seizure, most patients experience confusion, fatigue, muscle pain and/or a headache. Thus, one should permit the individual to sleep. For the next few days, reassurance is essential. Being calm and helping reorient the person is also of importance.  Most seizures are painless and end spontaneously. Seizures are not harmful to others but can lead to complications such as stress on the lungs, brain and the heart. Individuals with prior lung problems may develop labored breathing and respiratory distress.     Orders Placed This Encounter  Procedures   EEG adult    No orders of the defined types were placed in this encounter.   Return if symptoms worsen or fail to improve.    Windell Norfolk, MD 08/16/2022, 4:01 PM  [facility] [address], [address] [place], [place] [address] [phone]

## 2022-08-16 NOTE — Patient Instructions (Addendum)
Routine EEG, I will contact you to go over the result Continue your other medications Please call us if you do have another event Driving restriction for next 6 months Follow-up as needed

## 2022-08-31 ENCOUNTER — Ambulatory Visit (INDEPENDENT_AMBULATORY_CARE_PROVIDER_SITE_OTHER): Payer: Medicaid Other | Admitting: Neurology

## 2022-08-31 DIAGNOSIS — R569 Unspecified convulsions: Secondary | ICD-10-CM | POA: Diagnosis not present

## 2022-08-31 DIAGNOSIS — R55 Syncope and collapse: Secondary | ICD-10-CM

## 2022-09-06 NOTE — Procedures (Signed)
   HISTORY: 27 year old female with 1 passing out episode  TECHNIQUE:  This is a routine 16 channel EEG recording with one channel devoted to a limited EKG recording.  It was performed during wakefulness, drowsiness and asleep.  Hyperventilation and photic stimulation were performed as activating procedures.  There are minimum muscle and movement artifact noted.  Upon maximum arousal, posterior dominant waking rhythm consistent of rhythmic alpha range activity. Activities are symmetric over the bilateral posterior derivations and attenuated with eye opening.  Photic stimulation did not alter the tracing.  Hyperventilation produced mild/moderate buildup with higher amplitude and the slower activities noted.  During EEG recording, patient developed drowsiness and no deeper stage of sleep was achieved During EEG recording, there was no epileptiform discharge noted.  EKG demonstrate normal sinus rhythm.  CONCLUSION: This is a  normal awake EEG.  There is no electrodiagnostic evidence of epileptiform discharge.  Levert Feinstein, M.D. Ph.D.  [facility] [address] [place], [place] [address] Phone: [phone] Fax:      [phone]

## 2022-09-15 ENCOUNTER — Ambulatory Visit (HOSPITAL_COMMUNITY)
Admission: EM | Admit: 2022-09-15 | Discharge: 2022-09-15 | Disposition: A | Discharge status: Home / Self Care | Payer: Medicaid Other

## 2022-09-15 NOTE — ED Notes (Signed)
Patient left  the building per registration. Rn asked to discharge out the computer

## 2022-09-15 NOTE — Progress Notes (Signed)
   09/15/22 1234  [facility] Triage Screening (Walk-ins at [facility] only)  How Did You Hear About [place]? Family/Friend  What Is the Reason for Your Visit/Call Today? Pt presents to [facility] voluntarily accompanied by her mother due to SI  with a plan to either slit her wrist, jump in front of car, or overdose on medication.Pt reports being diagnosed with depression and ADHD and is not prescribed medication. Pt is not receiving outpatient therapy services. Pt reports increased stress due to health issues and other life stress, she did not elaborate . Pt reports past suicide attempt in 2017 where she attempted to overdose on medication, she was hospitalized at this time. Pt reports occasional marijuana use, last use was 1 week ago. Pt cannot contract for safety at this time. Pt denies HI and AVH currently.  How Long Has This Been Causing You Problems? <Week  Have You Recently Had Any Thoughts About Hurting Yourself? Yes  How long ago did you have thoughts about hurting yourself? today  Are You Planning to Commit Suicide/Harm Yourself At This time? Yes (slit wrist , jump in front of car, take pills)  Have you Recently Had Thoughts About Hurting Someone Karolee Ohs? No  Are You Planning To Harm Someone At This Time? No  Are you currently experiencing any auditory, visual or other hallucinations? No  Have You Used Any Alcohol or Drugs in the Past 24 Hours? No  Do you have any current medical co-morbidities that require immediate attention? No  Clinician description of patient physical appearance/behavior: tearful, fairly groomed, depressed mood with congruent affect  What Do You Feel Would Help You the Most Today? Treatment for Depression or other mood problem  If access to [facility] was not available, would you have sought care in the Emergency Department? No  Determination of Need Urgent (48 hours)  Options For Referral Inpatient Hospitalization

## 2023-02-17 IMAGING — US US ABDOMEN LIMITED
1 series · 14 of 25 positions shown · non-contrast
Comparison: None.

CLINICAL DATA: Right upper quadrant abdominal pain.

EXAM:
ULTRASOUND ABDOMEN LIMITED RIGHT UPPER QUADRANT

[Series 1: us abdomen limited ruq (liver/gb) · 14 of 41 slices shown]
[im 1/41]
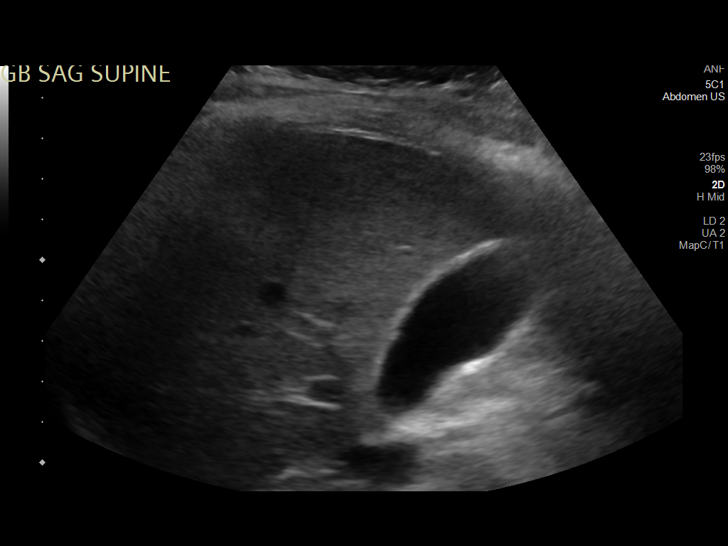
[im 4/41]
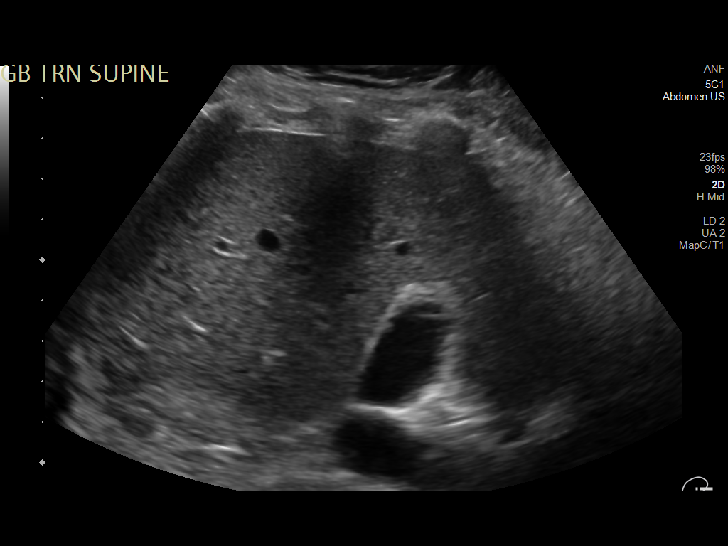
[im 7/41]
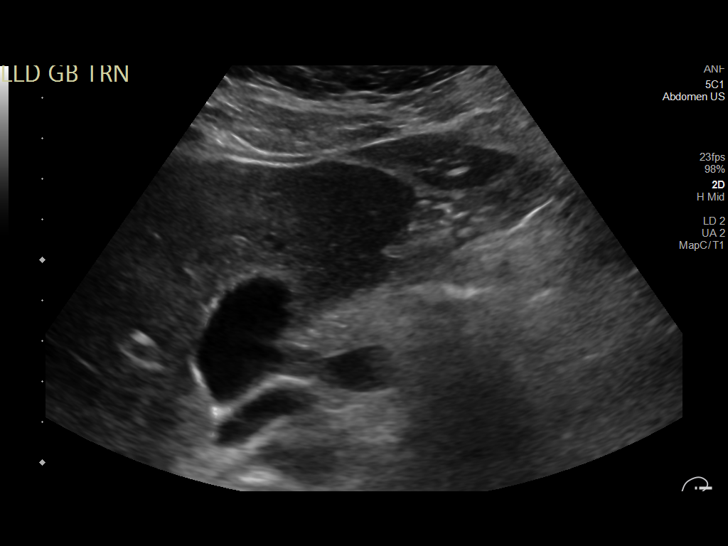
[im 11/41]
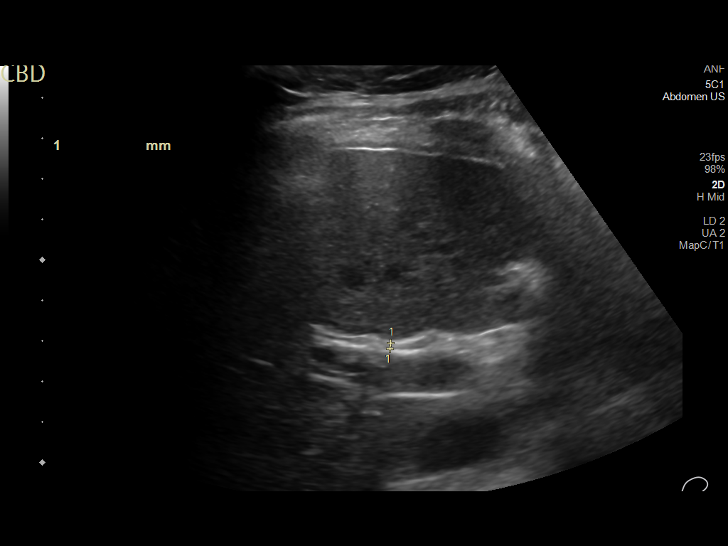
[im 14/41]
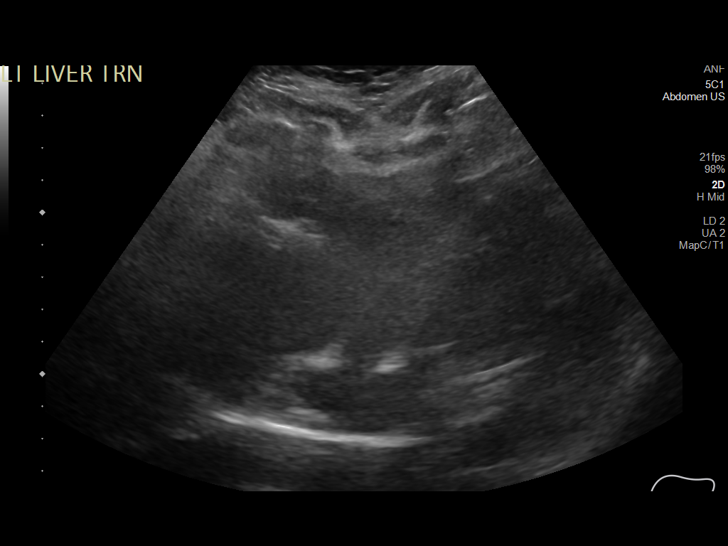
[im 16/41]
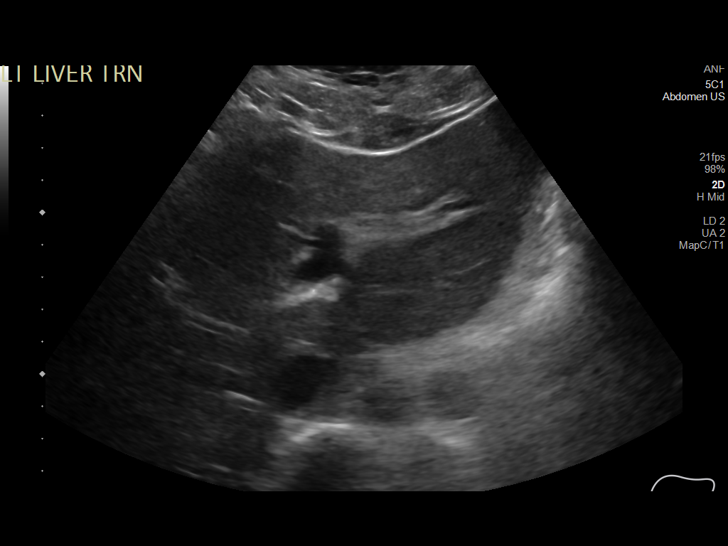
[im 19/41]
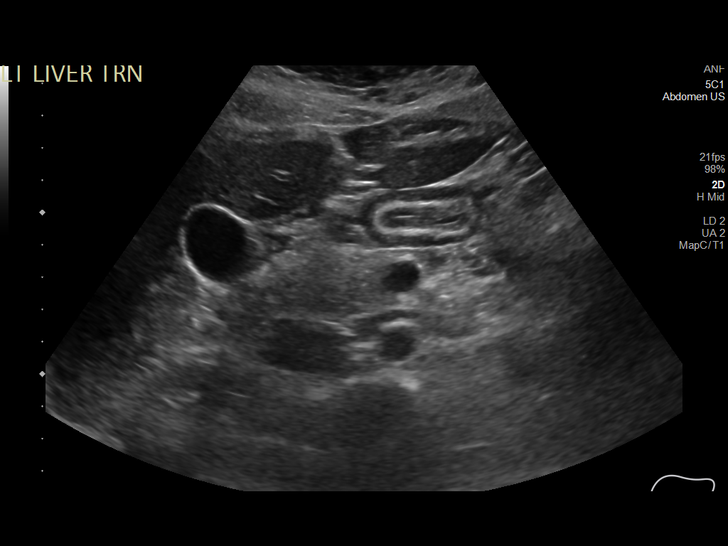
[im 22/41]
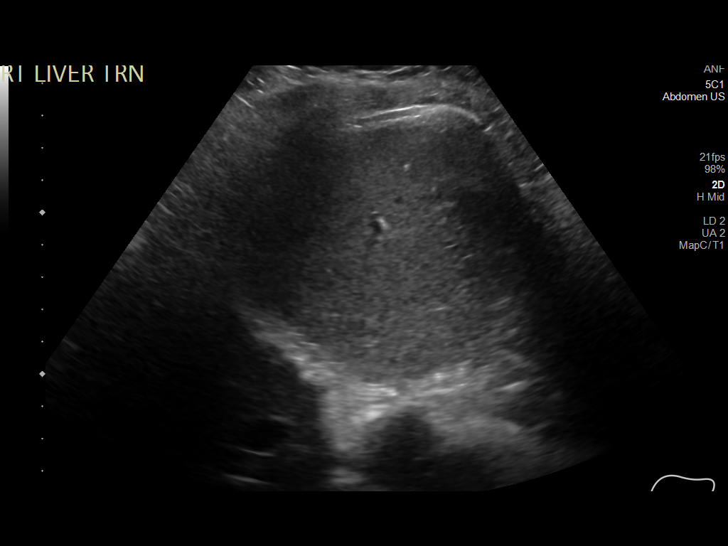
[im 26/41]
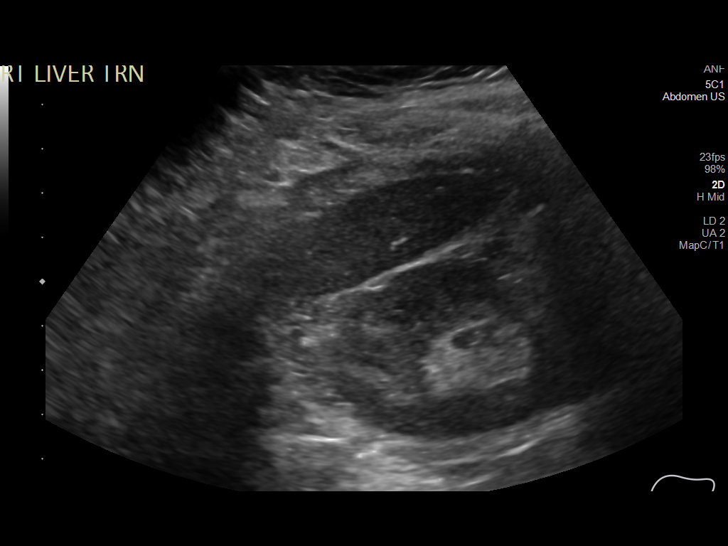
[im 27/41]
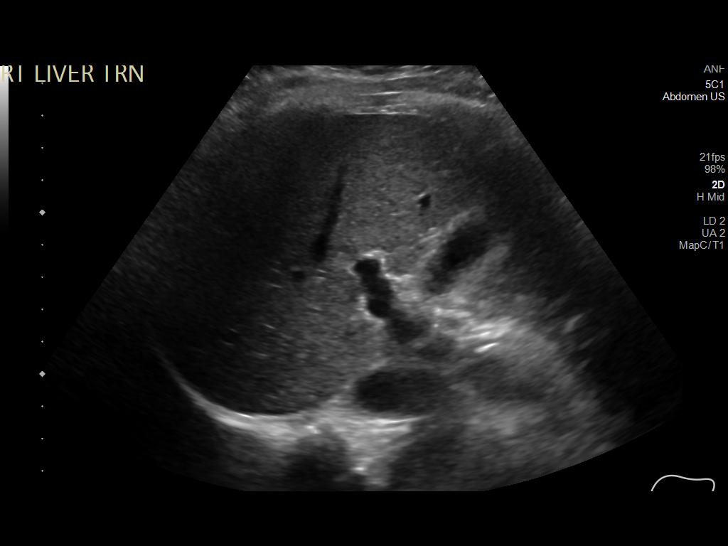
[im 31/41]
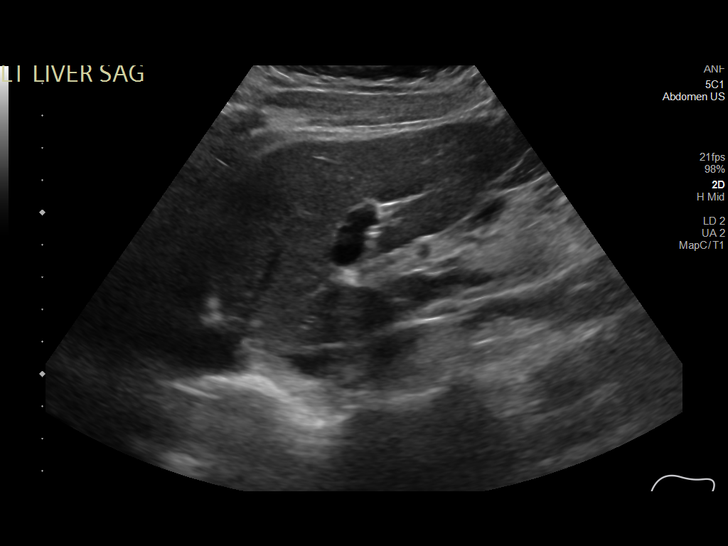
[im 34/41]
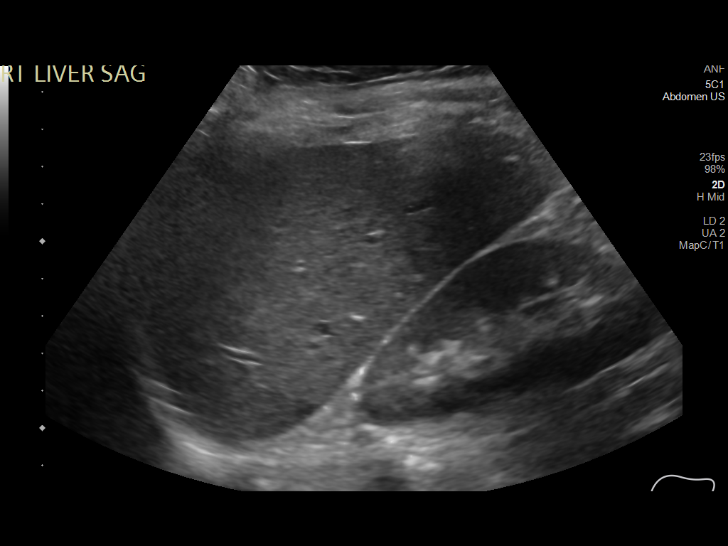
[im 37/41]
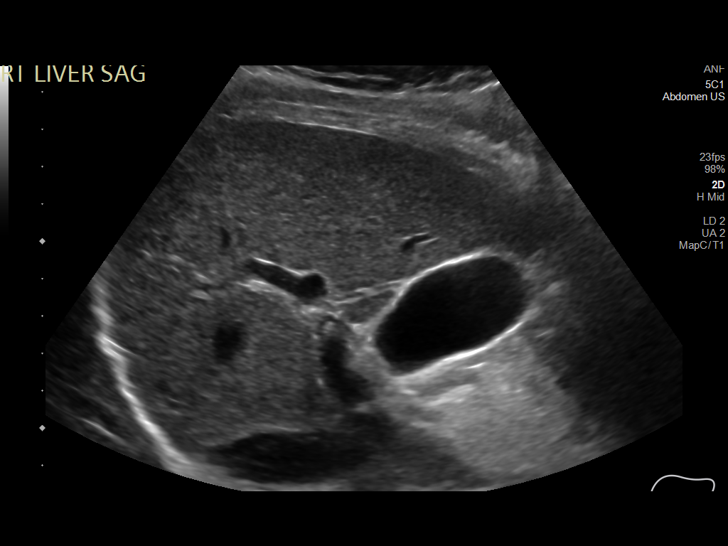
[im 41/41]
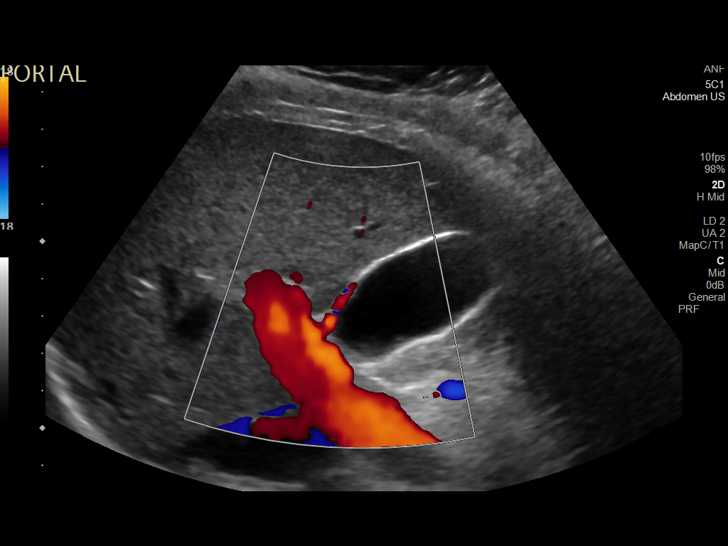

[14 of 25 positions shown; findings below may reference images not displayed]

FINDINGS: Gallbladder:

No gallstones or wall thickening visualized. No sonographic Murphy
sign noted by sonographer.

Common bile duct:

Diameter: 3 mm

Liver:

No focal lesion identified. Within normal limits in parenchymal
echogenicity. Portal vein is patent on color Doppler imaging with
normal direction of blood flow towards the liver.

Other: None.
IMPRESSION: Unremarkable right upper quadrant ultrasound.

## 2023-03-09 ENCOUNTER — Encounter (HOSPITAL_BASED_OUTPATIENT_CLINIC_OR_DEPARTMENT_OTHER): Payer: Self-pay

## 2023-03-09 ENCOUNTER — Other Ambulatory Visit: Payer: Self-pay

## 2023-03-09 NOTE — Progress Notes (Signed)
   03/09/23 1259  Pre-op Phone Call  Surgery Date Verified 03/16/23  Arrival Time Verified 1000  Surgery Location Verified [facility] [place]  Medical History Reviewed Yes  Is the patient taking a GLP-1 receptor agonist? No  Does the patient have diabetes? No diagnosis of diabetes  Do you have a history of heart problems? No  Antiarrhythmic device type  (NA)  Does patient have other implanted devices? No  Patient Teaching Pre / Post Procedure  Patient educated about smoking cessation 24 hours prior to surgery. N/A Non-Smoker  Patient verbalizes understanding of bowel prep? N/A  Med Rec Completed Yes  Take the Following Meds the Morning of Surgery no meds  NPO (Including gum & candy) After midnight  Stop Solids, Milk, Candy, and Gum STARTING AT MIDNIGHT  Responsible adult to drive and be with you for 24 hours? Yes  Name & Phone Number for Ride/Caregiver mom, Karna  No Jewelry, money, nail polish or make-up.  No lotions, powders, perfumes. No shaving  48 hrs. prior to surgery. Yes  Contacts, Dentures & Glasses Will Have to be Removed Before OR. Yes  Please bring your ID and Insurance Card the morning of your surgery. (Surgery Centers Only) Yes  Bring any papers or x-rays with you that your surgeon gave you. Yes  Instructed to contact the location of procedure/ provider if they or anyone in their household develops symptoms or tests positive for COVID-19, has close contact with someone who tests positive for COVID, or has known exposure to any contagious illness. Yes  Call this number the morning of surgery  with any problems that may cancel your surgery. [phone]  Covid-19 Assessment  Have you had a positive COVID-19 test within the previous 90 days? No  COVID Testing Guidance Proceed with the additional questions.  Patient's surgery required a COVID-19 test (cardiothoracic, complex ENT, and bronchoscopies/ EBUS) No  Have you been unmasked and in close contact with anyone with COVID-19 or  COVID-19 symptoms within the past 10 days? No  Do you or anyone in your household currently have any COVID-19 symptoms? No

## 2023-03-14 NOTE — H&P (Signed)
 Plastic and Reconstructive Surgery  Pre-operative History and Physical    Chief Complaint: Macromastia    Interval History:  Patient seen today for her pre-operative visit in advance of scheduled bilateral breast reduction on 1/10. She reports no changes to her medical status since last visit. She denies any recent illnesses, new medications/allergies, hospitalizations, or smoking/nicotine  use. She denies any new or recent fever, chills, infection, shortness of air, chest pain, palpitations, dizziness, LOC, cough, or other concerning symptoms.    Weight is stable since her initial visit. She tells me she has been nicotine -free for multiple months. She denies recent illicit drug use though she was previously smoking marijuana. She has been abstinent for ~4 weeks.      HPI from Initial Consult 10/25/22 Joan Rodriguez is a 28 y.o. female who is a patient of Dr. Moira and presents for an initial visit to discuss breast reduction.  She has a long history of macromastia since puberty. She has the following symptoms which she attributes to her large breasts:  back, neck, and shoulder pain;  shoulder grooving; and inframammary rashes for which she takes over the counter analgesics and antifungal creams/powders. She reports difficulty exercising due to her large breasts, which prevent high-impact exercises like running or jumping. She has tried OTC analgesics  to alleviate her back, neck and shoulder pain without improvement. She can only wear sports bras because other brassieres cause her pain in the inframammary area and shoulders due to the weight of her breasts.    Bra size is in the E-G range but sizing is difficult for her. This has been increasing in the past 5 years and continues to increase as her weight fluctuates. She has gradually gained 70 pounds or so in the past 6 years.    She denies any personal  history of breast cancer. Her paternal great grandmother evidently had breast cancer diagnosed  at a young age but she does not have a history in any of her primary relatives. Denies breast masses, swollen nodes, or nipple discharge. She does have a history of nicotine  use. She is non-diabetic.    Of note, she did have her breast rash recently evaluated by Dr. Moira. MRI was benign, showing no parenchymal process; skin was biopsied  to rule out malignancy. This revealed dermal edema and lymphohistiocytic inflammation. Rash has since improved significantly and nearly resolved.    She is interested in a significant reduction of her breast volume for alleviation of symptoms. She does not feel strongly about target cup size.    Mammogram: no; benign MRI  History of breast surgery: biopsy  - benign      Past Medical History:  Medical History      Past Medical History:  Diagnosis Date   Anxiety     Breast inflammation 06/19/2022   Depression     Oppositional defiant disorder      noted on chart review   Polysubstance dependence (CMD)      noted from chart review   PTSD (post-traumatic stress disorder)          Past Surgical History: Surgical History       Past Surgical History:  Procedure Laterality Date   COLONOSCOPY        Procedure: COLONOSCOPY        Social History: Social History         Tobacco Use   Smoking status: Former      Passive exposure: Never   Smokeless tobacco: Never  Tobacco comments:      Per patient quit vaping and all nicotine  products on 10/26/2022  Substance Use Topics   Alcohol use: Not Currently      Family History: family history includes Anxiety disorder in her mother; Arrhythmia in her maternal grandmother; Cancer in her paternal grandmother; Depression in her mother; High Cholesterol in her maternal grandmother and mother; Hypertension in her mother; Prostate cancer in her father; Sarcoidosis in her mother.   Allergies: Allergies      Allergies  Allergen Reactions   Norethindrone Ac-Eth Estradiol Rash         Medications: has a current medication list which includes the following prescription(s): clindamycin, tretinoin, and vitamedmd one rx.   Review of Systems: Pertinent items are noted in HPI.   Physical Examination:  height is 1.499 m (4' 11) and weight is 82.9 kg (182 lb 12.8 oz). Her temporal temperature is 97.5 F (36.4 C). Her blood pressure is 121/81 and her pulse is 82.  Body mass index is 36.92 kg/m.     Gen: well nourished , NAD CV: normotensive  Resp: normal work of breathing on room air  Skin: No suspicious lesions or rashes  Breast: Large heavy breasts grade 3 ptosis similar to previous. No rashes or open wounds.      Assessment/Plan 28 y.o. female with bilateral symptomatic macromastia seen pre-operatively in advance of 03/16/23 bilateral breast reduction.   We reviewed operative plan, incisional pattern, drain use, perioperative course, post-operative restrictions, and recovery. I outlined risks again with her, emphasizing risks of NAC necrosis and T-point wound formation given her BMI and anticipated large volume resection. We specifically discussed hematoma, infection, seroma, fat necrosis, need for emergent reoperation, damage to other nerves and vessel structures, difficulty or inability to breast feeding in the future, nipple ischemia, scarring, wound formation, the possible need for prolonged local wound care, asymmetries, need for revision operations, numbness/sensory disturbances of the breast skin and/or nipple, dissatisfaction with with cosmetic result, finding of malignancy on pathology. Informed consent was obtained today.    She would prefer NAC preservation technique. We reviewed the plan to attempt NAC preservation, though I will not hesitate to convert to free nipple graft during the operation with any signs of NAC vascular compromise. She is accepting of this. I answered all questions to her satisfaction.    Will plan to proceed with outpatient reduction  mammaplasty as planned, pending results of a nicotine  test which she will obtain today.

## 2023-03-16 ENCOUNTER — Other Ambulatory Visit: Payer: Self-pay

## 2023-03-16 ENCOUNTER — Encounter (HOSPITAL_BASED_OUTPATIENT_CLINIC_OR_DEPARTMENT_OTHER): Admission: RE | Disposition: A | Discharge status: Home / Self Care | Payer: Self-pay | Source: Ambulatory Visit

## 2023-03-16 ENCOUNTER — Ambulatory Visit (HOSPITAL_BASED_OUTPATIENT_CLINIC_OR_DEPARTMENT_OTHER)
Admission: RE | Admit: 2023-03-16 | Discharge: 2023-03-16 | Disposition: A | Discharge status: Home / Self Care | Payer: Medicaid Other | Source: Ambulatory Visit

## 2023-03-16 ENCOUNTER — Encounter (HOSPITAL_BASED_OUTPATIENT_CLINIC_OR_DEPARTMENT_OTHER): Payer: Self-pay

## 2023-03-16 ENCOUNTER — Ambulatory Visit (HOSPITAL_BASED_OUTPATIENT_CLINIC_OR_DEPARTMENT_OTHER): Payer: Medicaid Other | Admitting: Anesthesiology

## 2023-03-16 DIAGNOSIS — F4310 Post-traumatic stress disorder, unspecified: Secondary | ICD-10-CM | POA: Insufficient documentation

## 2023-03-16 DIAGNOSIS — F32A Depression, unspecified: Secondary | ICD-10-CM | POA: Diagnosis not present

## 2023-03-16 DIAGNOSIS — F909 Attention-deficit hyperactivity disorder, unspecified type: Secondary | ICD-10-CM | POA: Insufficient documentation

## 2023-03-16 DIAGNOSIS — Z87891 Personal history of nicotine dependence: Secondary | ICD-10-CM | POA: Insufficient documentation

## 2023-03-16 DIAGNOSIS — M542 Cervicalgia: Secondary | ICD-10-CM | POA: Diagnosis not present

## 2023-03-16 DIAGNOSIS — N62 Hypertrophy of breast: Secondary | ICD-10-CM | POA: Insufficient documentation

## 2023-03-16 DIAGNOSIS — M549 Dorsalgia, unspecified: Secondary | ICD-10-CM | POA: Insufficient documentation

## 2023-03-16 DIAGNOSIS — N6489 Other specified disorders of breast: Secondary | ICD-10-CM | POA: Diagnosis not present

## 2023-03-16 DIAGNOSIS — Z803 Family history of malignant neoplasm of breast: Secondary | ICD-10-CM | POA: Insufficient documentation

## 2023-03-16 DIAGNOSIS — Z419 Encounter for procedure for purposes other than remedying health state, unspecified: Secondary | ICD-10-CM

## 2023-03-16 DIAGNOSIS — R21 Rash and other nonspecific skin eruption: Secondary | ICD-10-CM | POA: Diagnosis not present

## 2023-03-16 DIAGNOSIS — F419 Anxiety disorder, unspecified: Secondary | ICD-10-CM | POA: Insufficient documentation

## 2023-03-16 HISTORY — PX: BREAST REDUCTION SURGERY: SHX8

## 2023-03-16 LAB — POCT PREGNANCY, URINE: Preg Test, Ur: NEGATIVE

## 2023-03-16 SURGERY — MAMMOPLASTY, REDUCTION
Anesthesia: General | Site: Breast | Laterality: Bilateral

## 2023-03-16 MED ORDER — FENTANYL CITRATE (PF) 100 MCG/2ML IJ SOLN
INTRAMUSCULAR | Status: AC
  Filled 2023-03-16: qty 2

## 2023-03-16 MED ORDER — BUPIVACAINE-EPINEPHRINE (PF) 0.25% -1:200000 IJ SOLN
INTRAMUSCULAR | Status: AC
  Filled 2023-03-16: qty 60

## 2023-03-16 MED ORDER — ONDANSETRON HCL 4 MG PO TABS: 4.0000 mg | ORAL_TABLET | Freq: Three times a day (TID) | ORAL | 0 refills | Status: DC | PRN

## 2023-03-16 MED ORDER — FENTANYL CITRATE (PF) 100 MCG/2ML IJ SOLN: 25.0000 ug | INTRAMUSCULAR | Status: DC | PRN

## 2023-03-16 MED ORDER — FENTANYL CITRATE (PF) 100 MCG/2ML IJ SOLN
INTRAMUSCULAR | Status: DC | PRN
  Administered 2023-03-16: 50 ug via INTRAVENOUS
  Administered 2023-03-16: 100 ug via INTRAVENOUS
  Administered 2023-03-16: 50 ug via INTRAVENOUS

## 2023-03-16 MED ORDER — ONDANSETRON HCL 4 MG/2ML IJ SOLN: 4.0000 mg | Freq: Four times a day (QID) | INTRAMUSCULAR | Status: DC | PRN

## 2023-03-16 MED ORDER — OXYCODONE HCL 5 MG PO TABS: 5.0000 mg | ORAL_TABLET | ORAL | 0 refills | Status: AC | PRN

## 2023-03-16 MED ORDER — POTASSIUM CHLORIDE IN NACL 20-0.9 MEQ/L-% IV SOLN: INTRAVENOUS | Status: DC | PRN

## 2023-03-16 MED ORDER — HYDROMORPHONE HCL 1 MG/ML IJ SOLN
INTRAMUSCULAR | Status: AC
  Filled 2023-03-16: qty 0.5

## 2023-03-16 MED ORDER — ONDANSETRON HCL 4 MG/2ML IJ SOLN
INTRAMUSCULAR | Status: DC | PRN
  Administered 2023-03-16: 4 mg via INTRAVENOUS

## 2023-03-16 MED ORDER — PHENYLEPHRINE HCL (PRESSORS) 10 MG/ML IV SOLN
INTRAVENOUS | Status: DC | PRN
  Administered 2023-03-16: 160 ug via INTRAVENOUS
  Administered 2023-03-16: 80 ug via INTRAVENOUS
  Administered 2023-03-16: 160 ug via INTRAVENOUS
  Administered 2023-03-16: 80 ug via INTRAVENOUS

## 2023-03-16 MED ORDER — OXYCODONE HCL 5 MG PO TABS: 5.0000 mg | ORAL_TABLET | Freq: Once | ORAL | Status: DC | PRN

## 2023-03-16 MED ORDER — ACETAMINOPHEN 500 MG PO TABS: 500.0000 mg | ORAL_TABLET | Freq: Four times a day (QID) | ORAL | Status: AC

## 2023-03-16 MED ORDER — AMISULPRIDE (ANTIEMETIC) 5 MG/2ML IV SOLN
10.0000 mg | Freq: Once | INTRAVENOUS | Status: AC
  Administered 2023-03-16: 10 mg via INTRAVENOUS

## 2023-03-16 MED ORDER — MIDAZOLAM HCL 2 MG/2ML IJ SOLN
INTRAMUSCULAR | Status: AC
  Filled 2023-03-16: qty 2

## 2023-03-16 MED ORDER — SODIUM CHLORIDE 0.9 % IV SOLN: INTRAVENOUS | Status: DC | PRN

## 2023-03-16 MED ORDER — MIDAZOLAM HCL 5 MG/5ML IJ SOLN
INTRAMUSCULAR | Status: DC | PRN
  Administered 2023-03-16: 2 mg via INTRAVENOUS

## 2023-03-16 MED ORDER — DEXMEDETOMIDINE HCL IN NACL 80 MCG/20ML IV SOLN
INTRAVENOUS | Status: DC | PRN
  Administered 2023-03-16: 8 ug via INTRAVENOUS
  Administered 2023-03-16: 12 ug via INTRAVENOUS

## 2023-03-16 MED ORDER — DEXAMETHASONE SODIUM PHOSPHATE 4 MG/ML IJ SOLN
INTRAMUSCULAR | Status: DC | PRN
  Administered 2023-03-16: 10 mg via INTRAVENOUS

## 2023-03-16 MED ORDER — OXYCODONE HCL 5 MG/5ML PO SOLN: 5.0000 mg | Freq: Once | ORAL | Status: DC | PRN

## 2023-03-16 MED ORDER — POLYETHYLENE GLYCOL 3350 17 G PO PACK: 17.0000 g | PACK | Freq: Every day | ORAL | 0 refills | Status: AC

## 2023-03-16 MED ORDER — 0.9 % SODIUM CHLORIDE (POUR BTL) OPTIME
TOPICAL | Status: DC | PRN
  Administered 2023-03-16: 1000 mL

## 2023-03-16 MED ORDER — LIDOCAINE HCL (CARDIAC) PF 100 MG/5ML IV SOSY
PREFILLED_SYRINGE | INTRAVENOUS | Status: DC | PRN
  Administered 2023-03-16: 50 mg via INTRAVENOUS

## 2023-03-16 MED ORDER — AMISULPRIDE (ANTIEMETIC) 5 MG/2ML IV SOLN
INTRAVENOUS | Status: AC
  Filled 2023-03-16: qty 4

## 2023-03-16 MED ORDER — HYDROMORPHONE HCL 1 MG/ML IJ SOLN
INTRAMUSCULAR | Status: DC | PRN
Start: 2023-03-16 — End: 2023-03-16
  Administered 2023-03-16 (×3): .5 mg via INTRAVENOUS

## 2023-03-16 MED ORDER — LACTATED RINGERS IV SOLN: INTRAVENOUS | Status: DC

## 2023-03-16 MED ORDER — SUGAMMADEX SODIUM 200 MG/2ML IV SOLN
INTRAVENOUS | Status: DC | PRN
  Administered 2023-03-16: 200 mg via INTRAVENOUS

## 2023-03-16 MED ORDER — BUPIVACAINE HCL (PF) 0.25 % IJ SOLN
INTRAMUSCULAR | Status: AC
  Filled 2023-03-16: qty 30

## 2023-03-16 MED ORDER — PROPOFOL 10 MG/ML IV BOLUS
INTRAVENOUS | Status: DC | PRN
  Administered 2023-03-16: 10 mg via INTRAVENOUS

## 2023-03-16 MED ORDER — CEFAZOLIN SODIUM-DEXTROSE 2-4 GM/100ML-% IV SOLN
INTRAVENOUS | Status: AC
  Filled 2023-03-16: qty 100

## 2023-03-16 MED ORDER — CEFAZOLIN SODIUM-DEXTROSE 2-4 GM/100ML-% IV SOLN
2.0000 g | INTRAVENOUS | Status: AC
  Administered 2023-03-16: 2 g via INTRAVENOUS

## 2023-03-16 MED ORDER — BUPIVACAINE-EPINEPHRINE (PF) 0.25% -1:200000 IJ SOLN
INTRAMUSCULAR | Status: DC | PRN
  Administered 2023-03-16: 70 mL

## 2023-03-16 MED ORDER — ROCURONIUM BROMIDE 100 MG/10ML IV SOLN
INTRAVENOUS | Status: DC | PRN
  Administered 2023-03-16: 60 mg via INTRAVENOUS

## 2023-03-16 SURGICAL SUPPLY — 58 items
BINDER BREAST 3XL (GAUZE/BANDAGES/DRESSINGS) IMPLANT
BINDER BREAST LRG (GAUZE/BANDAGES/DRESSINGS) IMPLANT
BINDER BREAST MEDIUM (GAUZE/BANDAGES/DRESSINGS) IMPLANT
BINDER BREAST XLRG (GAUZE/BANDAGES/DRESSINGS) IMPLANT
BINDER BREAST XXLRG (GAUZE/BANDAGES/DRESSINGS) IMPLANT
BLADE SURG 10 STRL SS (BLADE) ×4 IMPLANT
BNDG GAUZE DERMACEA FLUFF 4 (GAUZE/BANDAGES/DRESSINGS) ×2 IMPLANT
CANISTER SUCT 1200ML W/VALVE (MISCELLANEOUS) ×1 IMPLANT
CHLORAPREP W/TINT 26 (MISCELLANEOUS) ×2 IMPLANT
COVER BACK TABLE 60X90IN (DRAPES) ×1 IMPLANT
COVER MAYO STAND STRL (DRAPES) ×1 IMPLANT
DERMABOND ADVANCED .7 DNX12 (GAUZE/BANDAGES/DRESSINGS) ×2 IMPLANT
DRAIN CHANNEL 15F RND FF W/TCR (WOUND CARE) IMPLANT
DRAIN CHANNEL 19F RND (DRAIN) IMPLANT
DRAPE TOP ARMCOVERS (MISCELLANEOUS) ×1 IMPLANT
DRAPE U-SHAPE 76X120 STRL (DRAPES) ×1 IMPLANT
DRAPE UTILITY XL STRL (DRAPES) ×1 IMPLANT
ELECT COATED BLADE 2.86 ST (ELECTRODE) ×1 IMPLANT
ELECT REM PT RETURN 9FT ADLT (ELECTROSURGICAL) ×1
ELECTRODE REM PT RTRN 9FT ADLT (ELECTROSURGICAL) ×1 IMPLANT
EVACUATOR SILICONE 100CC (DRAIN) IMPLANT
GAUZE PAD ABD 8X10 STRL (GAUZE/BANDAGES/DRESSINGS) ×2 IMPLANT
GLOVE BIO SURGEON STRL SZ7.5 (GLOVE) ×1 IMPLANT
GLOVE BIOGEL PI IND STRL 7.0 (GLOVE) IMPLANT
GLOVE BIOGEL PI IND STRL 7.5 (GLOVE) IMPLANT
GLOVE BIOGEL PI IND STRL 8 (GLOVE) ×1 IMPLANT
GLOVE SURG SS PI 6.5 STRL IVOR (GLOVE) IMPLANT
GLOVE SURG SS PI 7.0 STRL IVOR (GLOVE) IMPLANT
GOWN STRL REUS W/ TWL LRG LVL3 (GOWN DISPOSABLE) ×1 IMPLANT
GOWN STRL REUS W/ TWL XL LVL3 (GOWN DISPOSABLE) ×1 IMPLANT
MARKER SKIN DUAL TIP RULER LAB (MISCELLANEOUS) IMPLANT
NDL HYPO 25X1 1.5 SAFETY (NEEDLE) ×1 IMPLANT
NEEDLE HYPO 25X1 1.5 SAFETY (NEEDLE) ×1
NS IRRIG 1000ML POUR BTL (IV SOLUTION) ×1 IMPLANT
PACK BASIN DAY SURGERY FS (CUSTOM PROCEDURE TRAY) ×1 IMPLANT
PENCIL SMOKE EVACUATOR (MISCELLANEOUS) ×1 IMPLANT
PIN SAFETY STERILE (MISCELLANEOUS) ×1 IMPLANT
SHEET MEDIUM DRAPE 40X70 STRL (DRAPES) ×1 IMPLANT
SLEEVE SCD COMPRESS KNEE MED (STOCKING) ×1 IMPLANT
SPONGE T-LAP 18X18 ~~LOC~~+RFID (SPONGE) ×3 IMPLANT
STAPLER SKIN PROX WIDE 3.9 (STAPLE) ×1 IMPLANT
STRIP SUTURE WOUND CLOSURE 1/2 (MISCELLANEOUS) IMPLANT
SUT ETHILON 2 0 FS 18 (SUTURE) IMPLANT
SUT MNCRL AB 3-0 PS2 27 (SUTURE) IMPLANT
SUT MNCRL AB 4-0 PS2 18 (SUTURE) IMPLANT
SUT PLAIN GUT FAST 5-0 (SUTURE) IMPLANT
SUT SILK 2 0 PERMA HAND 18 BK (SUTURE) IMPLANT
SUT STRATAFIX 0 PDS 27 VIOLET (SUTURE) ×2
SUT VIC AB 2-0 SH 27XBRD (SUTURE) IMPLANT
SUT VIC AB 3-0 PS1 18XBRD (SUTURE) IMPLANT
SUT VIC AB 4-0 PS2 18 (SUTURE) IMPLANT
SUTURE STRATFX 0 PDS 27 VIOLET (SUTURE) IMPLANT
SYR BULB IRRIG 60ML STRL (SYRINGE) ×1 IMPLANT
SYR CONTROL 10ML LL (SYRINGE) ×1 IMPLANT
TOWEL GREEN STERILE FF (TOWEL DISPOSABLE) ×2 IMPLANT
TUBE CONNECTING 20X1/4 (TUBING) ×1 IMPLANT
UNDERPAD 30X36 HEAVY ABSORB (UNDERPADS AND DIAPERS) ×2 IMPLANT
YANKAUER SUCT BULB TIP NO VENT (SUCTIONS) ×1 IMPLANT

## 2023-03-16 NOTE — Interval H&P Note (Signed)
 History and Physical Interval Note:  03/16/2023 11:04 AM  Joan Rodriguez  has presented today for surgery, with the diagnosis of macromastia.  The various methods of treatment have been discussed with the patient and family. After consideration of risks, benefits and other options for treatment, the patient has consented to  Procedure(s): MAMMARY REDUCTION  (BREAST) (Bilateral) as a surgical intervention.   Specific risks of surgery were discussed including: Bleeding, scarring, seroma, loss of nipple sensation, need for urgent return to the operating room, need for revision, asymmetry, nipple necrosis or loss, need for free nipple graft, wound formation, need for prolonged local wound care, fat necrosis, failure to achieve desired result, failure to relieve symptoms, deformity, infection.  Additionally risks of anesthesia were discussed including MI, stroke, DVT/PE, death.  The patient's history has been reviewed, patient examined, no change in status, stable for surgery.  I have reviewed the patient's chart and labs.  Questions were answered to the patient's satisfaction.      Taber Sweetser

## 2023-03-16 NOTE — Anesthesia Procedure Notes (Signed)
 Procedure Name: Intubation Date/Time: 03/16/2023 11:35 AM  Performed by: Emilio Rock BIRCH, CRNAPre-anesthesia Checklist: Patient identified, Emergency Drugs available, Suction available and Patient being monitored Patient Re-evaluated:Patient Re-evaluated prior to induction Oxygen Delivery Method: Circle system utilized Preoxygenation: Pre-oxygenation with 100% oxygen Induction Type: IV induction Ventilation: Mask ventilation without difficulty Laryngoscope Size: Mac and 3 Grade View: Grade I Tube type: Oral Tube size: 7.0 mm Number of attempts: 1 Airway Equipment and Method: Stylet and Oral airway Placement Confirmation: ETT inserted through vocal cords under direct vision, positive ETCO2 and breath sounds checked- equal and bilateral Secured at: 22 cm Tube secured with: Tape Dental Injury: Teeth and Oropharynx as per pre-operative assessment

## 2023-03-16 NOTE — Discharge Instructions (Addendum)
 Surgery Discharge Instructions:  1. Call your doctor or go to the emergency room if you have:  - Fever of 101 degrees or higher - Uncontrollable pain or pain that has increased greatly since your surgery - Increased redness around your incision (cut); incision pulling apart; thick, white drainage (pus), bleeding, or a large amount of fluid from your incision. - Nausea and vomiting that continue despite medications - Urine output less than is normal for you  - Chest pain/shortness of breath  2. Wound Care/Dressings/Drain Instructions:   - Do not shower for 24 hours. In the meantime it is OK to clean yourself with soapy washcloth and pat your skin/incisions dry. Do not submerge incisions underwater.  - Leave steri strips in place on your incisions until your follow up appointment. These will fall off on their own.  - Avoid heavy lifting and overhead activity (this may stretch or pull your incisions), straining/bearing down, or activities that may elevate your blood pressure. This includes sexual activity, exercise, and other forms of physical exertion. Please strictly avoid these activities to prevent problems with incision, wound formation, and bleeding.  - Make sure you get out of bed every few hours at home to prevent stagnation of blood which can result in a blood clot in your legs. It is important to try to mobilize every 3-4 hours during the day after your surgery.  - measure and record the total output from each drain and keep a log of 24 hour totals from each side, for each day after surgery until your follow up appointment.    3. Follow Up:  - A follow up appointment should be scheduled for you for approximately one week after discharge, and our secretaries will be contacting you with the details of that appointment. However, If you do not hear about your appoinment in 3 business days, please call the provided surgery clinic number and confirm/schedule your appointment.     4.  Activity/Restrictions:  - Resume your regular activities, as tolerated  5. Diet: - Resume your regular diet, as tolerated  6. Additional Instructions: - Please take an over the counter stool softener while taking narcotic pain medication - DO NOT MIX NARCOTIC PAIN MEDICATIONS OR TAKE NARCOTIC PRESCRIPTIONS AT THE SAME TIME (PERCODET, LORTAB, ROXICODONE , ETC.) - DO NOT DRIVE OR OPERATE HEAVY MACHINERY WHILE ON NARCOTICS  - DO NOT TAKE MORE THAN 4 GRAMS (4000mg ) OF TYLENOL  (ACETAMINOPHEN ) IN 24 HOURS ________________________________________________________________ Department of Plastic Surgery Contact Info: Plastic Surgery Office Contact for daytime hours - [phone] Office/ physician access line after hours [phone]   If no answer, contact [phone] (Patient Access Line)    Post Anesthesia Home Care Instructions  Activity: Get plenty of rest for the remainder of the day. A responsible individual must stay with you for 24 hours following the procedure.  For the next 24 hours, DO NOT: -Drive a car -Advertising copywriter -Drink alcoholic beverages -Take any medication unless instructed by your physician -Make any legal decisions or sign important papers.  Meals: Start with liquid foods such as gelatin or soup. Progress to regular foods as tolerated. Avoid greasy, spicy, heavy foods. If nausea and/or vomiting occur, drink only clear liquids until the nausea and/or vomiting subsides. Call your physician if vomiting continues.  Special Instructions/Symptoms: Your throat may feel dry or sore from the anesthesia or the breathing tube placed in your throat during surgery. If this causes discomfort, gargle with warm salt water. The discomfort should disappear within 24 hours.  If  you had a scopolamine patch placed behind your ear for the management of post- operative nausea and/or vomiting:  1. The medication in the patch is effective for 72 hours, after which it should be removed.   Wrap patch in a tissue and discard in the trash. Wash hands thoroughly with soap and water. 2. You may remove the patch earlier than 72 hours if you experience unpleasant side effects which may include dry mouth, dizziness or visual disturbances. 3. Avoid touching the patch. Wash your hands with soap and water after contact with the patch.  About my Jackson-Pratt Bulb Drain  What is a Jackson-Pratt bulb? A Jackson-Pratt is a soft, round device used to collect drainage. It is connected to a long, thin drainage catheter, which is held in place by one or two small stiches near your surgical incision site. When the bulb is squeezed, it forms a vacuum, forcing the drainage to empty into the bulb.  Emptying the Jackson-Pratt bulb- To empty the bulb: 1. Release the plug on the top of the bulb. 2. Pour the bulb's contents into a measuring container which your nurse will provide. 3. Record the time emptied and amount of drainage. Empty the drain(s) as often as your     doctor or nurse recommends.  Date                  Time                    Amount (Drain 1)                 Amount (Drain 2)  _____________________________________________________________________  _____________________________________________________________________  _____________________________________________________________________  _____________________________________________________________________  _____________________________________________________________________  _____________________________________________________________________  _____________________________________________________________________  _____________________________________________________________________  Squeezing the Jackson-Pratt Bulb- To squeeze the bulb: 1. Make sure the plug at the top of the bulb is open. 2. Squeeze the bulb tightly in your fist. You will hear air squeezing from the bulb. 3. Replace the plug while the bulb is  squeezed. 4. Use a safety pin to attach the bulb to your clothing. This will keep the catheter from     pulling at the bulb insertion site.  When to call your doctor- Call your doctor if: Drain site becomes red, swollen or hot. You have a fever greater than 101 degrees F. There is oozing at the drain site. Drain falls out (apply a guaze bandage over the drain hole and secure it with tape). Drainage increases daily not related to activity patterns. (You will usually have more drainage when you are active than when you are resting.) Drainage has a bad odor.

## 2023-03-16 NOTE — Anesthesia Preprocedure Evaluation (Signed)
 Anesthesia Evaluation  Patient identified by MRN, date of birth, ID band Patient awake    Reviewed: Allergy  & Precautions, H&P , NPO status , Patient's Chart, lab work & pertinent test results  Airway Mallampati: II   Neck ROM: full    Dental   Pulmonary former smoker   breath sounds clear to auscultation       Cardiovascular negative cardio ROS  Rhythm:regular Rate:Normal     Neuro/Psych  PSYCHIATRIC DISORDERS Anxiety Depression    ADHD PTSD   GI/Hepatic ,,,(+)     substance abuse    Endo/Other    Renal/GU      Musculoskeletal   Abdominal   Peds  Hematology   Anesthesia Other Findings   Reproductive/Obstetrics                             Anesthesia Physical Anesthesia Plan  ASA: 2  Anesthesia Plan: General   Post-op Pain Management:    Induction: Intravenous  PONV Risk Score and Plan: 3 and Ondansetron , Dexamethasone , Midazolam  and Treatment may vary due to age or medical condition  Airway Management Planned: Oral ETT  Additional Equipment:   Intra-op Plan:   Post-operative Plan: Extubation in OR  Informed Consent: I have reviewed the patients History and Physical, chart, labs and discussed the procedure including the risks, benefits and alternatives for the proposed anesthesia with the patient or authorized representative who has indicated his/her understanding and acceptance.     Dental advisory given  Plan Discussed with: CRNA, Anesthesiologist and Surgeon  Anesthesia Plan Comments:        Anesthesia Quick Evaluation

## 2023-03-16 NOTE — Transfer of Care (Signed)
 Immediate Anesthesia Transfer of Care Note  Patient: Joan Rodriguez  Procedure(s) Performed: MAMMARY REDUCTION  (BREAST) (Bilateral: Breast)  Patient Location: PACU  Anesthesia Type:General  Level of Consciousness: awake, alert , and patient cooperative  Airway & Oxygen Therapy: Patient Spontanous Breathing and Patient connected to face mask oxygen  Post-op Assessment: Report given to RN and Post -op Vital signs reviewed and stable  Post vital signs: Reviewed and stable  Last Vitals:  Vitals Value Taken Time  BP    Temp    Pulse 95 03/16/23 1602  Resp 19 03/16/23 1602  SpO2 98 % 03/16/23 1602  Vitals shown include unfiled device data.  Last Pain:  Vitals:   03/16/23 0957  TempSrc: Temporal  PainSc: 0-No pain      Patients Stated Pain Goal: 5 (03/16/23 0957)  Complications: No notable events documented.

## 2023-03-17 NOTE — Anesthesia Postprocedure Evaluation (Signed)
 Anesthesia Post Note  Patient: Joan Rodriguez  Procedure(s) Performed: MAMMARY REDUCTION  (BREAST) (Bilateral: Breast)     Patient location during evaluation: PACU Anesthesia Type: General Level of consciousness: awake and alert Pain management: pain level controlled Vital Signs Assessment: post-procedure vital signs reviewed and stable Respiratory status: spontaneous breathing, nonlabored ventilation, respiratory function stable and patient connected to nasal cannula oxygen Cardiovascular status: blood pressure returned to baseline and stable Postop Assessment: no apparent nausea or vomiting Anesthetic complications: no   No notable events documented.  Last Vitals:  Vitals:   03/16/23 1645 03/16/23 1715  BP: 109/64 126/73  Pulse: 98 (!) 114  Resp: 12 16  Temp:  (!) 36.3 C  SpO2: 98% 94%    Last Pain:  Vitals:   03/16/23 1715  TempSrc:   PainSc: 4                  [name] S

## 2023-03-17 NOTE — Op Note (Addendum)
 DATE:  03/16/23   PRE-OP DIAGNOSIS: Bilateral Symptomatic Macromastia   POST-OP DIAGNOSIS: Same   PROCEDURE: Bilateral Breast Reduction  Superior-medial pedicle, Wise pattern skin excision   SURGEON: Gaila Josephs, MD   IVF:  900 cc crystalloid  EBL:  100 mL    SPECIMENS: Left Breast Tissue:   1247 gms  Right Breast Tissue: 1362 gms    DRAINS: 15 [place] JP drain, one per side    COMPLICATIONS: None immediately noted      DISPOSITION: Stable to PACU Extubated   HISTORY/INDICATION: The patient is a 28 year old female with symptoms and history consistent with bilateral symptomatic macromastia including back, neck, and shoulder pain. She was consented for bilateral breast reduction. I discussed risks and benefits with the patient pre-operatively with the patient including bleeding, scarring, seroma, loss of nipple sensation, need for urgent return to the operating room, need for revision, asymmetry, nipple necrosis or loss, need for free nipple graft, wound formation, need for prolonged local wound care, fat necrosis, failure to achieve desired result, failure to relieve symptoms, deformity, infection.  She accepts these risks and elected to proceed.    DESCRIPTION OF PROCEDURE:  The patient was seen and marked in the standing position for a Wise-pattern breast reduction. She was taken to the operating room and placed supine on the operating room table. SCDs were placed on bilateral lower extremities and all and all pressure points padded. She was administered IV antibiosis. General anesthesia was induced. Timeout was performed. The chest was prepped and draped in standard fashion.    Next, breast markings were compared for symmetry. The right breast was approached first. A generous superior-medial pedicle was designed around the marked NAC. Wise pattern markings were then fully incised and the pedicle was de-epithelialized. The IMF marking was incised and bovie was used to dissect to the pec  fascia along its entire length and then carried superiorly with care to avoid undermining the pedicle.  The borders of the pedicle were incised and dissection carried down to the chest wall. Superior, medial and lateral flaps were raised, with specific attention given to appropriate thinning of the lateral flaps. The pedicle was inspected throughout and additional tissue excised from the posterior aspect of the pedicle until volume reduction was deemed adequate. Specimens were passed off for weighing.   The breast was irrigated with saline solution and hemostasis was achieved with electrocautery. A JP drain was placed along the IMF via the lateral-most aspect of the IMF incision and secured with 3-0 silk suture.    The nipple was then gently rotated into it's new position on its pedicle and closure of the wise pattern simulated temporarily with staples. It was obvious that the pedicle volume would be accommodated without undue tension upon closure. The NAC was inspected via the vertical limb incision and noted to remain viable on its pedicle.    The same exact procedure was performed on the left breast. Pedicle and flap thickness was compared and relative symmetry noted compared to the right side.    The patient was placed in the upright position and size and shape symmetry were confirmed. The new nipple position was marked. Due to the excessive length of her superior medial pedicle and large resection weights, the new nipple position was de-epithelialized in preparation for a possible free nipple graft. The 'keyhole' new nipple position was bisected vertically and the nipple again inspected bilaterally and noted to be viable. It was determined that free nipple grafting would not be  necessary, so the keyhole was excised to make room for nipple inset. Inset of the nipple was done bilaterally with 3-0 vicryl deep dermal and 3-0 monocryl subcuticular suture. Horizontal limbs were closed with a deep layer of  running 0 stratafix barbed suture followed by 3-0 vicryl deep dermal and 3-0 monocryl subcuticular. Vertical limbs were closed in layers with 3-0 vicryl deep dermal and 3-0 monocryl subcuticular suture.    At the conclusion of the case, both NACs had normal color and turgor.  The incisions were dressed with steri strips. Sterile dressings were applied followed by surgical bra.   The patient tolerated the procedure well without any complications. Resection specimens were sent as separate specimens for pathology.    All instrument, sponge and lap counts were correct at the end of the case. I contacted the patient's loved one with an update.

## 2023-03-18 ENCOUNTER — Encounter (HOSPITAL_BASED_OUTPATIENT_CLINIC_OR_DEPARTMENT_OTHER): Payer: Self-pay

## 2023-03-20 LAB — SURGICAL PATHOLOGY

## 2023-07-17 ENCOUNTER — Encounter (HOSPITAL_COMMUNITY): Payer: Self-pay

## 2023-07-17 ENCOUNTER — Ambulatory Visit (HOSPITAL_COMMUNITY)
Admission: RE | Admit: 2023-07-17 | Discharge: 2023-07-17 | Disposition: A | Discharge status: Home / Self Care | Source: Ambulatory Visit | Attending: Family Medicine | Admitting: Family Medicine

## 2023-07-17 VITALS — BP 113/78 | HR 74 | Temp 98.2°F | Resp 18

## 2023-07-17 DIAGNOSIS — R8290 Unspecified abnormal findings in urine: Secondary | ICD-10-CM | POA: Insufficient documentation

## 2023-07-17 DIAGNOSIS — K529 Noninfective gastroenteritis and colitis, unspecified: Secondary | ICD-10-CM | POA: Diagnosis not present

## 2023-07-17 DIAGNOSIS — Z3202 Encounter for pregnancy test, result negative: Secondary | ICD-10-CM

## 2023-07-17 DIAGNOSIS — B349 Viral infection, unspecified: Secondary | ICD-10-CM | POA: Insufficient documentation

## 2023-07-17 LAB — POCT URINALYSIS DIP (MANUAL ENTRY)
Bilirubin, UA: NEGATIVE
Blood, UA: NEGATIVE
Glucose, UA: NEGATIVE mg/dL
Nitrite, UA: NEGATIVE
Spec Grav, UA: >=1.03 — AB (ref 1.010–1.025)
Urobilinogen, UA: 0.2 U/dL
pH, UA: 5.5 (ref 5.0–8.0)

## 2023-07-17 LAB — POC COVID19/FLU A&B COMBO
Covid Antigen, POC: NEGATIVE
Influenza A Antigen, POC: NEGATIVE
Influenza B Antigen, POC: NEGATIVE

## 2023-07-17 LAB — POCT URINE PREGNANCY: Preg Test, Ur: NEGATIVE

## 2023-07-17 LAB — POCT RAPID STREP A (OFFICE): Rapid Strep A Screen: NEGATIVE

## 2023-07-17 MED ORDER — ONDANSETRON 4 MG PO TBDP: 4.0000 mg | ORAL_TABLET | Freq: Three times a day (TID) | ORAL | 0 refills | Status: DC | PRN

## 2023-07-17 NOTE — Discharge Instructions (Addendum)
 Focus on hydration with electrolyte replacement solutions Zofran  has been sent to the pharmacy to help with nausea We will contact you if your urine culture indicates you have a UTI - no need for antibiotics today Clear liquids today, progress diet tomorrow as able

## 2023-07-17 NOTE — ED Triage Notes (Addendum)
 Pt states she has had some diarrhea, nausea, vomiting, fever, headache and sore throat since  yesterday. Fever was 101 at home today so she took some nyquil.   Pt states she took pregnancy test this morning and it was neg, she declined to take another.

## 2023-07-17 NOTE — ED Provider Notes (Signed)
 [facility]    CSN: [id number] Arrival date & time: 07/17/23  1752      History   Chief Complaint Chief Complaint  Patient presents with   Nausea    Since yesterday it started off with a headache didn't think nothing of cause I get them often . Then light headed and now feeling nauseous with a pain in my side and weak . - Entered by patient   Sore Throat   Fever   Emesis   Diarrhea    HPI Joan Rodriguez is a 28 y.o. female.   Patient presents requesting evaluation for the new onset of headache, nausea, diarrhea, sore throat, episodic vomiting, and a fever (measured up to 101).  Patient states that she has taken NyQuil and Aleve for the symptoms.  No reported runny nose, cough, chest pain, or shortness of breath.  No known sick contacts.  She took a pregnancy test which resulted negative at home.  The history is provided by the patient.  Sore Throat Pertinent negatives include no chest pain, no abdominal pain, no headaches and no shortness of breath.  Fever Associated symptoms: diarrhea, nausea, sore throat and vomiting   Associated symptoms: no chest pain, no chills, no congestion, no cough, no dysuria, no headaches, no myalgias, no rash and no rhinorrhea   Emesis Associated symptoms: diarrhea, fever and sore throat   Associated symptoms: no abdominal pain, no chills, no cough, no headaches and no myalgias   Diarrhea Associated symptoms: fever and vomiting   Associated symptoms: no abdominal pain, no chills, no headaches and no myalgias     Past Medical History:  Diagnosis Date   ADHD (attention deficit hyperactivity disorder)    Anxiety    Depressed    Depression    Oppositional defiant disorder    Polysubstance use disorder    PTSD (post-traumatic stress disorder)     Patient Active Problem List   Diagnosis Date Noted   Major depression, recurrent (HCC) 01/07/2019   Abnormal uterine bleeding (AUB) 11/26/2013   BV (bacterial vaginosis) 11/26/2013    Polysubstance dependence (HCC) 08/14/2011   Oppositional defiant disorder 08/14/2011   MDD (major depressive disorder) 08/12/2011   ADHD (attention deficit hyperactivity disorder), combined type 08/12/2011    Past Surgical History:  Procedure Laterality Date   BREAST REDUCTION SURGERY Bilateral 03/16/2023   Procedure: MAMMARY REDUCTION  (BREAST);  Surgeon: Gaila Josephs, MD;  Location: [facility];  Service: Plastics;  Laterality: Bilateral;    OB History     Gravida  1   Para  0   Term  0   Preterm  0   AB  1   Living  0      SAB  1   IAB  0   Ectopic  0   Multiple  0   Live Births               Home Medications    Prior to Admission medications   Medication Sig Start Date End Date Taking? Authorizing Provider  ondansetron  (ZOFRAN -ODT) 4 MG disintegrating tablet Take 1 tablet (4 mg total) by mouth every 8 (eight) hours as needed for nausea or vomiting. 07/17/23  Yes Genene Kennel, FNP  ondansetron  (ZOFRAN ) 4 MG tablet Take 1 tablet (4 mg total) by mouth every 8 (eight) hours as needed for nausea or vomiting. 03/16/23   Maus, Jacob, MD  polyethylene glycol (MIRALAX ) 17 g packet Take 17 g by mouth daily.  Take while you are on narcotic pain medicine to prevent constipation 03/16/23   Gaila Josephs, MD    Family History Family History  Problem Relation Age of Onset   Sarcoidosis Mother    Hypertension Mother    Depression Mother    Anxiety disorder Mother    Prostate cancer Father    Cancer Sister    High Cholesterol Maternal Grandmother    Cancer Paternal Grandmother        lung?   Stomach cancer Neg Hx    Esophageal cancer Neg Hx    Colon cancer Neg Hx     Social History Social History   Tobacco Use   Smoking status: Former    Current packs/day: 0.00    Types: Cigarettes    Quit date: 07/05/2011    Years since quitting: 12.0   Smokeless tobacco: Never  Vaping Use   Vaping status: Never Used  Substance Use Topics   Alcohol use:  No   Drug use: Yes    Frequency: 35.0 times per week    Types: Marijuana     Allergies   Patient has no known allergies.   Review of Systems Review of Systems  Constitutional:  Positive for fever. Negative for chills and fatigue.  HENT:  Positive for sore throat and voice change. Negative for congestion and rhinorrhea.   Eyes:  Negative for pain and redness.  Respiratory:  Negative for cough, chest tightness and shortness of breath.   Cardiovascular:  Negative for chest pain and palpitations.  Gastrointestinal:  Positive for diarrhea, nausea and vomiting. Negative for abdominal pain.  Genitourinary:  Negative for dysuria and urgency.  Musculoskeletal:  Negative for back pain and myalgias.  Skin:  Negative for rash.  Neurological:  Negative for dizziness and headaches.   Physical Exam Triage Vital Signs ED Triage Vitals  Encounter Vitals Group     BP 07/17/23 1810 113/78     Systolic BP Percentile --      Diastolic BP Percentile --      Pulse Rate 07/17/23 1810 74     Resp 07/17/23 1810 18     Temp 07/17/23 1810 98.2 F (36.8 C)     Temp Source 07/17/23 1810 Oral     SpO2 07/17/23 1810 98 %     Weight --      Height --      Head Circumference --      Peak Flow --      Pain Score 07/17/23 1808 9     Pain Loc --      Pain Education --      Exclude from Growth Chart --    No data found.  Updated Vital Signs BP 113/78 (BP Location: Right Arm)   Pulse 74   Temp 98.2 F (36.8 C) (Oral)   Resp 18   LMP 06/25/2023 (Exact Date)   SpO2 98%   Physical Exam Vitals and nursing note reviewed.  Constitutional:      Appearance: Normal appearance. She is well-developed.  HENT:     Head: Normocephalic.     Right Ear: Tympanic membrane and ear canal normal.     Left Ear: Tympanic membrane and ear canal normal.     Nose: Nose normal.     Mouth/Throat:     Mouth: Mucous membranes are moist.     Pharynx: No posterior oropharyngeal erythema.     Comments: Tongue blue -  states she ate Airhead candy Eyes:  Conjunctiva/sclera: Conjunctivae normal.     Pupils: Pupils are equal, round, and reactive to light.  Cardiovascular:     Rate and Rhythm: Normal rate and regular rhythm.     Heart sounds: Normal heart sounds.  Pulmonary:     Effort: Pulmonary effort is normal.     Breath sounds: Normal breath sounds.  Abdominal:     General: Bowel sounds are normal.  Skin:    General: Skin is warm and dry.  Neurological:     General: No focal deficit present.     Mental Status: She is alert and oriented to person, place, and time.  Psychiatric:        Mood and Affect: Mood normal.        Behavior: Behavior normal.        Thought Content: Thought content normal.        Judgment: Judgment normal.      UC Treatments / Results  Labs (all labs ordered are listed, but only abnormal results are displayed) Labs Reviewed  POCT URINALYSIS DIP (MANUAL ENTRY) - Abnormal; Notable for the following components:      Result Value   Clarity, UA turbid (*)    Ketones, POC UA trace (5) (*)    Spec Grav, UA >=1.030 (*)    Protein Ur, POC trace (*)    Leukocytes, UA Trace (*)    All other components within normal limits  URINE CULTURE  POCT URINE PREGNANCY  POCT RAPID STREP A (OFFICE)  POC COVID19/FLU A&B COMBO    EKG   Radiology No results found.  Procedures Procedures (including critical care time)  Medications Ordered in UC Medications - No data to display  Initial Impression / Assessment and Plan / UC Course  I have reviewed the triage vital signs and the nursing notes.  Pertinent labs & imaging results that were available during my care of the patient were reviewed by me and considered in my medical decision making (see chart for details).     Patient is here for evaluation of viral syndrome type symptoms.  Negative COVID, flu, strep testing.  A POCT UA was slightly abnormal-more indicative for dehydration than infection.  Patient states she has  had a little bit of an abnormal odor to her urine but no dysuria, urinary frequency, hesitancy.  Will send urine off for culture to ensure we are not missing an infection.  Patient will be contacted for change in treatment plan if results indicate need for antibiotic.  I discussed electrolytes fluid replacements and a prescription for Zofran  has been sent to the pharmacy to help alleviate any nausea.    Final Clinical Impressions(s) / UC Diagnoses   Final diagnoses:  Acute viral syndrome  Gastroenteritis  Abnormal urine findings     Discharge Instructions      Focus on hydration with electrolyte replacement solutions Zofran  has been sent to the pharmacy to help with nausea We will contact you if your urine culture indicates you have a UTI - no need for antibiotics today Clear liquids today, progress diet tomorrow as able   ED Prescriptions     Medication Sig Dispense Auth. Provider   ondansetron  (ZOFRAN -ODT) 4 MG disintegrating tablet Take 1 tablet (4 mg total) by mouth every 8 (eight) hours as needed for nausea or vomiting. 20 tablet Genene Kennel, FNP      PDMP not reviewed this encounter.   Genene Kennel, FNP 07/17/23 1919

## 2023-07-18 LAB — URINE CULTURE

## 2023-07-19 ENCOUNTER — Ambulatory Visit (HOSPITAL_COMMUNITY): Payer: Self-pay

## 2023-10-06 ENCOUNTER — Encounter (HOSPITAL_COMMUNITY): Payer: Self-pay

## 2023-10-06 ENCOUNTER — Ambulatory Visit (HOSPITAL_COMMUNITY): Admission: EM | Admit: 2023-10-06 | Discharge: 2023-10-06 | Disposition: A | Discharge status: Home / Self Care

## 2023-10-06 DIAGNOSIS — R519 Headache, unspecified: Secondary | ICD-10-CM | POA: Diagnosis not present

## 2023-10-06 DIAGNOSIS — Z3202 Encounter for pregnancy test, result negative: Secondary | ICD-10-CM | POA: Diagnosis not present

## 2023-10-06 DIAGNOSIS — R112 Nausea with vomiting, unspecified: Secondary | ICD-10-CM

## 2023-10-06 DIAGNOSIS — R109 Unspecified abdominal pain: Secondary | ICD-10-CM | POA: Diagnosis not present

## 2023-10-06 DIAGNOSIS — N939 Abnormal uterine and vaginal bleeding, unspecified: Secondary | ICD-10-CM

## 2023-10-06 LAB — POCT URINE PREGNANCY: Preg Test, Ur: NEGATIVE

## 2023-10-06 MED ORDER — ONDANSETRON 4 MG PO TBDP: 4.0000 mg | ORAL_TABLET | Freq: Three times a day (TID) | ORAL | 0 refills | Status: DC | PRN

## 2023-10-06 MED ORDER — IBUPROFEN 800 MG PO TABS: 800.0000 mg | ORAL_TABLET | Freq: Three times a day (TID) | ORAL | 0 refills | Status: AC

## 2023-10-06 NOTE — Discharge Instructions (Signed)
 You can take Zofran  every 8 hours as needed for nausea and vomiting.  This will dissolve under your tongue. Take 800 mg ibuprofen  once every 8 hours as needed for abdominal cramping. Otherwise you can take 500 to 1000 mg of Tylenol  every 6-8 hours as needed for abdominal cramping or mild headache. Make sure you are staying hydrated and getting plenty of rest If your symptoms continue into Monday I would call your fertility doctor to make them aware so that you can be seen by them. If you develop heavy vaginal bleeding, worsening abdominal pain, excessive vomiting despite medication, fever, or weakness please seek immediate medical treatment in the emergency department.

## 2023-10-06 NOTE — ED Provider Notes (Signed)
 [facility]    CSN: [id number] Arrival date & time: 10/06/23  1011      History   Chief Complaint Chief Complaint  Patient presents with   Abdominal Pain    HPI Joan Rodriguez is a 28 y.o. female.   Patient presents with nausea, vomiting, abdominal cramping, vaginal spotting, and intermittent headache since she had a hysterosalpingogram performed on 7/28.  Patient states that she was told that the vaginal spotting and abdominal cramping cramping could be normal, but was concerned because the cramping continues and and she is not having nausea and vomiting.  Denies heavy vaginal bleeding, constant abdominal pain, fever, body aches, chills, diarrhea, weakness, dizziness, and urinary symptoms.  Patient states that she did take a Midol  yesterday without relief.  The history is provided by the patient and medical records.  Abdominal Pain   Past Medical History:  Diagnosis Date   ADHD (attention deficit hyperactivity disorder)    Anxiety    Depressed    Depression    Oppositional defiant disorder    Polysubstance use disorder    PTSD (post-traumatic stress disorder)     Patient Active Problem List   Diagnosis Date Noted   Major depression, recurrent (HCC) 01/07/2019   Abnormal uterine bleeding (AUB) 11/26/2013   BV (bacterial vaginosis) 11/26/2013   Polysubstance dependence (HCC) 08/14/2011   Oppositional defiant disorder 08/14/2011   MDD (major depressive disorder) 08/12/2011   ADHD (attention deficit hyperactivity disorder), combined type 08/12/2011    Past Surgical History:  Procedure Laterality Date   BREAST REDUCTION SURGERY Bilateral 03/16/2023   Procedure: MAMMARY REDUCTION  (BREAST);  Surgeon: Elfrieda Cadet, MD;  Location: [facility];  Service: Plastics;  Laterality: Bilateral;    OB History     Gravida  1   Para  0   Term  0   Preterm  0   AB  1   Living  0      SAB  1   IAB  0   Ectopic  0   Multiple  0   Live  Births               Home Medications    Prior to Admission medications   Medication Sig Start Date End Date Taking? Authorizing Provider  EPINEPHrine  0.3 mg/0.3 mL IJ SOAJ injection Inject 0.3 mg into the muscle as needed for anaphylaxis. 09/05/23  Yes [provider]  ibuprofen  (ADVIL ) 800 MG tablet Take 1 tablet (800 mg total) by mouth 3 (three) times daily. 10/06/23  Yes Johnie, Ellenor Wisniewski A, NP  ondansetron  (ZOFRAN -ODT) 4 MG disintegrating tablet Take 1 tablet (4 mg total) by mouth every 8 (eight) hours as needed for nausea or vomiting. 10/06/23  Yes Johnie, Prudencio Velazco A, NP  polyethylene glycol (MIRALAX ) 17 g packet Take 17 g by mouth daily. Take while you are on narcotic pain medicine to prevent constipation 03/16/23   Elfrieda Cadet, MD    Family History Family History  Problem Relation Age of Onset   Sarcoidosis Mother    Hypertension Mother    Depression Mother    Anxiety disorder Mother    Prostate cancer Father    Cancer Sister    High Cholesterol Maternal Grandmother    Cancer Paternal Grandmother        lung?   Stomach cancer Neg Hx    Esophageal cancer Neg Hx    Colon cancer Neg Hx     Social History Social History  Tobacco Use   Smoking status: Former    Current packs/day: 0.00    Types: Cigarettes    Quit date: 07/05/2011    Years since quitting: 12.2   Smokeless tobacco: Never  Vaping Use   Vaping status: Never Used  Substance Use Topics   Alcohol use: No   Drug use: Yes    Frequency: 35.0 times per week    Types: Marijuana     Allergies   Patient has no known allergies.   Review of Systems Review of Systems  Gastrointestinal:  Positive for abdominal pain.   Per HPI  Physical Exam Triage Vital Signs ED Triage Vitals  Encounter Vitals Group     BP 10/06/23 1038 104/72     Girls Systolic BP Percentile --      Girls Diastolic BP Percentile --      Boys Systolic BP Percentile --      Boys Diastolic BP Percentile --      Pulse Rate  10/06/23 1038 80     Resp 10/06/23 1038 16     Temp 10/06/23 1038 98.1 F (36.7 C)     Temp Source 10/06/23 1038 Oral     SpO2 10/06/23 1038 95 %     Weight --      Height --      Head Circumference --      Peak Flow --      Pain Score 10/06/23 1035 10     Pain Loc --      Pain Education --      Exclude from Growth Chart --    No data found.  Updated Vital Signs BP 104/72 (BP Location: Left Arm)   Pulse 80   Temp 98.1 F (36.7 C) (Oral)   Resp 16   LMP 09/27/2023 (Exact Date)   SpO2 95%   Visual Acuity Right Eye Distance:   Left Eye Distance:   Bilateral Distance:    Right Eye Near:   Left Eye Near:    Bilateral Near:     Physical Exam Vitals and nursing note reviewed.  Constitutional:      General: She is awake. She is not in acute distress.    Appearance: Normal appearance. She is well-developed and well-groomed. She is not ill-appearing.  Abdominal:     General: Abdomen is protuberant. Bowel sounds are normal.     Palpations: Abdomen is soft.     Tenderness: There is abdominal tenderness in the right lower quadrant and left lower quadrant. There is no right CVA tenderness, left CVA tenderness, guarding or rebound. Negative signs include Rovsing's sign, McBurney's sign, psoas sign and obturator sign.     Comments: Mild tenderness noted to RLQ and LLQ without guarding or rebound tenderness.  Skin:    General: Skin is warm and dry.  Neurological:     Mental Status: She is alert.  Psychiatric:        Behavior: Behavior is cooperative.      UC Treatments / Results  Labs (all labs ordered are listed, but only abnormal results are displayed) Labs Reviewed  POCT URINE PREGNANCY    EKG   Radiology No results found.  Procedures Procedures (including critical care time)  Medications Ordered in UC Medications - No data to display  Initial Impression / Assessment and Plan / UC Course  I have reviewed the triage vital signs and the nursing  notes.  Pertinent labs & imaging results that were available during my care of  the patient were reviewed by me and considered in my medical decision making (see chart for details).     Patient is overall well-appearing.  Vitals are stable.  Mild tenderness upon palpation to RLQ and LLQ without guarding or rebound tenderness.  Urine pregnancy negative.  Prescribe Zofran  as needed for nausea and vomiting.  Prescribed ibuprofen  as needed for abdominal cramping recommended alternating this with Tylenol .  Discussed follow-up, return, and strict ER precautions. Final Clinical Impressions(s) / UC Diagnoses   Final diagnoses:  Vaginal spotting  Abdominal cramping  Intermittent headache  Nausea and vomiting, unspecified vomiting type     Discharge Instructions      You can take Zofran  every 8 hours as needed for nausea and vomiting.  This will dissolve under your tongue. Take 800 mg ibuprofen  once every 8 hours as needed for abdominal cramping. Otherwise you can take 500 to 1000 mg of Tylenol  every 6-8 hours as needed for abdominal cramping or mild headache. Make sure you are staying hydrated and getting plenty of rest If your symptoms continue into Monday I would call your fertility doctor to make them aware so that you can be seen by them. If you develop heavy vaginal bleeding, worsening abdominal pain, excessive vomiting despite medication, fever, or weakness please seek immediate medical treatment in the emergency department.   ED Prescriptions     Medication Sig Dispense Auth. Provider   ondansetron  (ZOFRAN -ODT) 4 MG disintegrating tablet Take 1 tablet (4 mg total) by mouth every 8 (eight) hours as needed for nausea or vomiting. 10 tablet Johnie Flaming A, NP   ibuprofen  (ADVIL ) 800 MG tablet Take 1 tablet (800 mg total) by mouth 3 (three) times daily. 21 tablet Johnie Flaming A, NP      PDMP not reviewed this encounter.   Johnie Flaming A, NP 10/06/23 1433

## 2023-10-06 NOTE — ED Triage Notes (Signed)
 Patient here today with c/o constant headache, nausea, vomiting, abd cramping, vaginal bleeding for 9 days. Patient also states that she has occasional sharp pains in her chest. Patient states that she had a fertility treatment where they injected ink into her tubes and has been having these symptoms since.

## 2023-10-30 ENCOUNTER — Ambulatory Visit (INDEPENDENT_AMBULATORY_CARE_PROVIDER_SITE_OTHER): Admitting: Allergy and Immunology

## 2023-10-30 VITALS — BP 122/78 | HR 86 | Ht 59.0 in | Wt 180.0 lb

## 2023-10-30 DIAGNOSIS — J301 Allergic rhinitis due to pollen: Secondary | ICD-10-CM | POA: Diagnosis not present

## 2023-10-30 DIAGNOSIS — T781XXD Other adverse food reactions, not elsewhere classified, subsequent encounter: Secondary | ICD-10-CM

## 2023-10-30 DIAGNOSIS — T7840XD Allergy, unspecified, subsequent encounter: Secondary | ICD-10-CM

## 2023-10-30 MED ORDER — EPINEPHRINE 0.3 MG/0.3ML IJ SOAJ: 0.3000 mg | INTRAMUSCULAR | 1 refills | Status: AC | PRN

## 2023-10-30 NOTE — Patient Instructions (Addendum)
  1. Return for food skin testing without antihistamine use  2. Epi-pen, benadryl (MD/ER evaluation) for allergic reaction  3. Continue nasal steroid and antihistamine during spring and fall  4. Influenza = Tamiflu. Covid = Paxlovid  5. Plan for fall flu vaccine

## 2023-10-30 NOTE — Progress Notes (Unsigned)
 [place] - [place] - [place] - [place] - [place]   Dear Ilda,  Thank you for referring Joan Rodriguez to the [facility] [place]  on 10/30/2023.   Below is a summation of this patient's evaluation and recommendations.  Thank you for your referral. I will keep you informed about this patient's response to treatment.   If you have any questions please do not hesitate to contact me.   Sincerely,  Camellia DOROTHA Denis, MD Allergy / Immunology [facility] [place]    ______________________________________________________________________    NEW PATIENT NOTE  Referring Provider: Ilda Cadet, PA-C Primary Provider: Ilda Cadet RIGGERS Date of office visit: 10/30/2023    Subjective:   Chief Complaint:  Joan Rodriguez (DOB: Feb 05, 1996) is a 28 y.o. female who presents to the clinic on 10/30/2023 with a chief complaint of Establish Care (Grass makes her feet/legs itch; possible allergic reaction to seafood boil several years ago, eats shrimp with no problem now, does have EpiPen ; denies previous allergy testing) .     HPI: Joan Rodriguez presents to this clinic in evaluation of an allergic reaction.  Approximately 4 years ago she developed an episode of throat swelling that required her to go to the emergency room.  This occurred in the context of eating a seafood boil that contained shellfish, sausage, corn, spices.  Since that point in time she has eaten shellfish and sausage and corn and eggs and seasonings with no problem.  She does have other atopic disease.  She has sneezing and itchy watery eyes during the spring and fall but if she uses her nasal spray and her Zyrtec and some eyedrops it appears to work quite well and she goes through the seasons without much difficulty.  She did have a history of childhood asthma that for the most part is resolved and she can now have cold air exposure and exercise with no  problems.  In 2017 she was given a estrogen progesterone combination contraceptive agent and developed a episode of urticaria on the third day of use requiring her to go to the emergency room.  She has not restarted those agents since that point in time.  Past Medical History:  Diagnosis Date   ADHD (attention deficit hyperactivity disorder)    Anxiety    Depressed    Depression    Oppositional defiant disorder    Polysubstance use disorder    PTSD (post-traumatic stress disorder)     Past Surgical History:  Procedure Laterality Date   BREAST REDUCTION SURGERY Bilateral 03/16/2023   Procedure: MAMMARY REDUCTION  (BREAST);  Surgeon: Elfrieda Cadet, MD;  Location: [facility];  Service: Plastics;  Laterality: Bilateral;    Allergies as of 10/30/2023   No Known Allergies      Medication List    EPINEPHrine  0.3 mg/0.3 mL Soaj injection Commonly known as: EPI-PEN Inject 0.3 mg into the muscle as needed for anaphylaxis.   ibuprofen  800 MG tablet Commonly known as: ADVIL  Take 1 tablet (800 mg total) by mouth 3 (three) times daily.   ondansetron  4 MG disintegrating tablet Commonly known as: ZOFRAN -ODT Take 1 tablet (4 mg total) by mouth every 8 (eight) hours as needed for nausea or vomiting.   polyethylene glycol 17 g packet Commonly known as: MiraLax  Take 17 g by mouth daily. Take while you are on narcotic pain medicine to prevent constipation    Review of systems negative except as noted in  HPI / PMHx or noted below:  Review of Systems  Constitutional: Negative.   HENT: Negative.    Eyes: Negative.   Respiratory: Negative.    Cardiovascular: Negative.   Gastrointestinal: Negative.   Genitourinary: Negative.   Musculoskeletal: Negative.   Skin: Negative.   Neurological: Negative.   Endo/Heme/Allergies: Negative.   Psychiatric/Behavioral: Negative.      Family History  Problem Relation Age of Onset   Sarcoidosis Mother    Hypertension Mother     Depression Mother    Anxiety disorder Mother    Prostate cancer Father    Cancer Sister    High Cholesterol Maternal Grandmother    Cancer Paternal Grandmother        lung?   Stomach cancer Neg Hx    Esophageal cancer Neg Hx    Colon cancer Neg Hx     Social History   Socioeconomic History   Marital status: Single    Spouse name: Not on file   Number of children: 0   Years of education: Not on file   Highest education level: Not on file  Occupational History   Not on file  Tobacco Use   Smoking status: Former    Current packs/day: 0.00    Types: Cigarettes    Quit date: 07/05/2011    Years since quitting: 12.3   Smokeless tobacco: Never  Vaping Use   Vaping status: Never Used  Substance and Sexual Activity   Alcohol use: No   Drug use: Yes    Frequency: 35.0 times per week    Types: Marijuana   Sexual activity: Yes    Birth control/protection: None  Other Topics Concern   Not on file  Social History Narrative   Not on file   Social Drivers of Health   Financial Resource Strain: Not on file  Food Insecurity: Not on file  Transportation Needs: Not on file  Physical Activity: Not on file  Stress: Not on file  Social Connections: Not on file  Intimate Partner Violence: Not on file    Environmental and Social history  Lives in a house with a dry environment, dogs and cats look inside the household, no carpet in the bedroom, plastic in bed, plastic on the pillow, intermittently smoking cigars, and employment at [employer].  Objective:   Vitals:   10/30/23 1459  BP: 122/78  Pulse: 86  SpO2: 96%   Height: 4' 11 (149.9 cm) Weight: 180 lb (81.6 kg)  Physical Exam Constitutional:      Appearance: She is not diaphoretic.  HENT:     Head: Normocephalic.     Right Ear: Tympanic membrane, ear canal and external ear normal.     Left Ear: Tympanic membrane, ear canal and external ear normal.     Nose: Nose normal. No mucosal edema or rhinorrhea.      Mouth/Throat:     Pharynx: Uvula midline. No oropharyngeal exudate.  Eyes:     Conjunctiva/sclera: Conjunctivae normal.  Neck:     Thyroid: No thyromegaly.     Trachea: Trachea normal. No tracheal tenderness or tracheal deviation.  Cardiovascular:     Rate and Rhythm: Normal rate and regular rhythm.     Heart sounds: Normal heart sounds, S1 normal and S2 normal. No murmur heard. Pulmonary:     [place]: No respiratory distress.     Breath sounds: Normal breath sounds. No stridor. No wheezing or rales.  Lymphadenopathy:     Head:     Right  side of head: No tonsillar adenopathy.     Left side of head: No tonsillar adenopathy.     Cervical: No cervical adenopathy.  Skin:    Findings: No erythema or rash.     Nails: There is no clubbing.  Neurological:     Mental Status: She is alert.     Diagnostics: Allergy skin tests were performed.   Spirometry was performed and demonstrated an FEV1 of *** @ *** % of predicted. FEV1/FVC = ***  The patient had an Asthma Control Test with the following results:  .     Assessment and Plan:    No diagnosis found.  Patient Instructions   1. Return for food skin testing without antihistamine use  2. Epi-pen, benadryl (MD/ER evaluation) for allergic reaction  3. Continue nasal steroid and antihistamine during spring and fall  4. Influenza = Tamiflu. Covid = Paxlovid  5. Plan for fall flu vaccine   Camellia DOROTHA Denis, MD Allergy / Immunology [facility] [place] 

## 2023-10-31 ENCOUNTER — Encounter: Payer: Self-pay | Admitting: Allergy and Immunology

## 2023-11-03 ENCOUNTER — Ambulatory Visit (HOSPITAL_COMMUNITY)
Admission: EM | Admit: 2023-11-03 | Discharge: 2023-11-03 | Disposition: A | Discharge status: Home / Self Care | Attending: Emergency Medicine | Admitting: Emergency Medicine

## 2023-11-03 ENCOUNTER — Emergency Department (HOSPITAL_COMMUNITY)
Admission: EM | Admit: 2023-11-03 | Discharge: 2023-11-03 | Disposition: A | Discharge status: Home / Self Care | Attending: Emergency Medicine | Admitting: Emergency Medicine

## 2023-11-03 ENCOUNTER — Emergency Department (HOSPITAL_COMMUNITY)

## 2023-11-03 ENCOUNTER — Encounter (HOSPITAL_COMMUNITY): Payer: Self-pay

## 2023-11-03 ENCOUNTER — Other Ambulatory Visit: Payer: Self-pay

## 2023-11-03 ENCOUNTER — Encounter (HOSPITAL_COMMUNITY): Payer: Self-pay | Admitting: Emergency Medicine

## 2023-11-03 DIAGNOSIS — R1030 Lower abdominal pain, unspecified: Secondary | ICD-10-CM | POA: Diagnosis not present

## 2023-11-03 DIAGNOSIS — R1084 Generalized abdominal pain: Secondary | ICD-10-CM | POA: Insufficient documentation

## 2023-11-03 DIAGNOSIS — R112 Nausea with vomiting, unspecified: Secondary | ICD-10-CM

## 2023-11-03 LAB — CBC
HCT: 44.2 % (ref 36.0–46.0)
Hemoglobin: 14.3 g/dL (ref 12.0–15.0)
MCH: 30.4 pg (ref 26.0–34.0)
MCHC: 32.4 g/dL (ref 30.0–36.0)
MCV: 94 fL (ref 80.0–100.0)
Platelets: 347 K/uL (ref 150–400)
RBC: 4.7 MIL/uL (ref 3.87–5.11)
RDW: 13.4 % (ref 11.5–15.5)
WBC: 9.5 K/uL (ref 4.0–10.5)
nRBC: 0 % (ref 0.0–0.2)

## 2023-11-03 LAB — COMPREHENSIVE METABOLIC PANEL WITH GFR
ALT: 23 U/L (ref 0–44)
AST: 20 U/L (ref 15–41)
Albumin: 3.5 g/dL (ref 3.5–5.0)
Alkaline Phosphatase: 68 U/L (ref 38–126)
Anion gap: 9 (ref 5–15)
BUN: 8 mg/dL (ref 6–20)
CO2: 24 mmol/L (ref 22–32)
Calcium: 9.1 mg/dL (ref 8.9–10.3)
Chloride: 106 mmol/L (ref 98–111)
Creatinine, Ser: 0.63 mg/dL (ref 0.44–1.00)
GFR, Estimated: >60 mL/min (ref >60)
Glucose, Bld: 87 mg/dL (ref 70–99)
Potassium: 4 mmol/L (ref 3.5–5.1)
Sodium: 139 mmol/L (ref 135–145)
Total Bilirubin: 0.6 mg/dL (ref 0.0–1.2)
Total Protein: 4.6 g/dL — ABNORMAL LOW (ref 6.5–8.1)

## 2023-11-03 LAB — URINALYSIS, ROUTINE W REFLEX MICROSCOPIC
Bilirubin Urine: NEGATIVE
Glucose, UA: NEGATIVE mg/dL
Hgb urine dipstick: NEGATIVE
Ketones, ur: NEGATIVE mg/dL
Nitrite: NEGATIVE
Protein, ur: NEGATIVE mg/dL
Specific Gravity, Urine: 1.024 (ref 1.005–1.030)
pH: 8 (ref 5.0–8.0)

## 2023-11-03 LAB — POCT URINALYSIS DIP (MANUAL ENTRY)
Blood, UA: NEGATIVE
Glucose, UA: NEGATIVE mg/dL
Nitrite, UA: NEGATIVE
Protein Ur, POC: 30 mg/dL — AB
Spec Grav, UA: 1.025 (ref 1.010–1.025)
Urobilinogen, UA: 1 U/dL
pH, UA: 7 (ref 5.0–8.0)

## 2023-11-03 LAB — WET PREP, GENITAL
Sperm: NONE SEEN
Trich, Wet Prep: NONE SEEN
WBC, Wet Prep HPF POC: >=10 — AB (ref <10)
Yeast Wet Prep HPF POC: NONE SEEN

## 2023-11-03 LAB — HCG, SERUM, QUALITATIVE: Preg, Serum: NEGATIVE

## 2023-11-03 LAB — POCT URINE PREGNANCY: Preg Test, Ur: NEGATIVE

## 2023-11-03 LAB — LIPASE, BLOOD: Lipase: 27 U/L (ref 11–51)

## 2023-11-03 MED ORDER — IOHEXOL 350 MG/ML SOLN
75.0000 mL | Freq: Once | INTRAVENOUS | Status: AC | PRN
  Administered 2023-11-03: 75 mL via INTRAVENOUS

## 2023-11-03 MED ORDER — ONDANSETRON HCL 4 MG/2ML IJ SOLN
4.0000 mg | Freq: Once | INTRAMUSCULAR | Status: AC
  Administered 2023-11-03: 4 mg via INTRAVENOUS
  Filled 2023-11-03: qty 2

## 2023-11-03 MED ORDER — METRONIDAZOLE 500 MG PO TABS: 500.0000 mg | ORAL_TABLET | Freq: Two times a day (BID) | ORAL | 0 refills | Status: AC

## 2023-11-03 NOTE — ED Triage Notes (Signed)
 Pt c.o headache, nausea and abd pain, intermittent for the past month. States she was seen at a fertility clinic and injected with a dye to see if she could have kids and ever since then she's felt sick

## 2023-11-03 NOTE — ED Provider Triage Note (Signed)
 Emergency Medicine Provider Triage Evaluation Note  Joan Rodriguez , a 28 y.o. female  was evaluated in triage.  Pt complains of lower abdominal pain, nausea nd vomiting.  Pt reports symptoms started after having a his stereo [place] go gram on 728  Review of Systems  Positive: Nausea, vomiting and diarrhea Negative: fever  Physical Exam  BP 126/82   Pulse 71   Temp 98.2 F (36.8 C)   Resp 14   Ht 4' 11 (1.499 m)   Wt 81.6 kg   LMP 10/28/2023 (Exact Date)   SpO2 100%   BMI 36.36 kg/m  Gen:   Awake, no distress   Resp:  Normal effort  MSK:   Moves extremities without difficulty  Other:    Medical Decision Making  Medically screening exam initiated at 5:51 PM.  Appropriate orders placed.  KARIE SKOWRON was informed that the remainder of the evaluation will be completed by another provider, this initial triage assessment does not replace that evaluation, and the importance of remaining in the ED until their evaluation is complete.     Flint Sonny POUR, PA-C 11/03/23 [phone]

## 2023-11-03 NOTE — ED Triage Notes (Signed)
 Pt arrive to UC c/o abd pain, HA, nausea, vomiting for the past month. Pt states she had her period 4 times during this month after she at a fetility treatment. Pt is been seen before for same sx. Pt states she is taking Ibuprofen  with no relief.

## 2023-11-03 NOTE — ED Notes (Signed)
 Patient is being discharged from the Urgent Care and sent to the Emergency Department via private vehicle . Per provider, patient is in need of higher level of care due to ongoing abd pain with nausea and vomiting. Patient is aware and verbalizes understanding of plan of care.  Vitals:   11/03/23 1513  BP: 118/69  Pulse: 84  Resp: 18  Temp: 98.6 F (37 C)  SpO2: 100%

## 2023-11-03 NOTE — ED Provider Notes (Signed)
 [facility]    CSN: [id number] Arrival date & time: 11/03/23  1447      History   Chief Complaint Chief Complaint  Patient presents with   Abdominal Pain    HPI Joan Rodriguez is a 28 y.o. female.   Patient presents with continued abdominal pain, nausea, vomiting, and headache after hysterosalpingogram on 7/28.  Patient was seen here previously on 8/2 for similar symptoms, but states that her symptoms have become persistent and worse since then.  Patient states that she did run out of the Zofran  that she was prescribed last time, and now has nausea and vomiting every day mostly in the morning.  Patient reports that her abdominal pain is constant and worse than before.  Patient denies continued vaginal spotting.    Patient reports that she has had intermittent fever over the last few weeks as well.  At one point her temperature read as high as 103.  Patient states that she was at work a few days ago and felt lightheaded and weak and checked her temperature and it was elevated at that time 2.  Denies cough, congestion, shortness of breath, and chest pain.  Patient states that she has been taking ibuprofen  and Tylenol  without relief.  Patient reports that she has tried to get back in with her fertility doctor, but has been unable to see them because they are too busy.  Patient states that she has tried to message and call the office multiple times without getting any appointments.  The history is provided by the patient and medical records.  Abdominal Pain   Past Medical History:  Diagnosis Date   ADHD (attention deficit hyperactivity disorder)    Anxiety    Depressed    Depression    Oppositional defiant disorder    Polysubstance use disorder    PTSD (post-traumatic stress disorder)     Patient Active Problem List   Diagnosis Date Noted   Major depression, recurrent (HCC) 01/07/2019   Abnormal uterine bleeding (AUB) 11/26/2013   BV (bacterial vaginosis)  11/26/2013   Polysubstance dependence (HCC) 08/14/2011   Oppositional defiant disorder 08/14/2011   MDD (major depressive disorder) 08/12/2011   ADHD (attention deficit hyperactivity disorder), combined type 08/12/2011    Past Surgical History:  Procedure Laterality Date   BREAST REDUCTION SURGERY Bilateral 03/16/2023   Procedure: MAMMARY REDUCTION  (BREAST);  Surgeon: Elfrieda Cadet, MD;  Location: [facility];  Service: Plastics;  Laterality: Bilateral;    OB History     Gravida  1   Para  0   Term  0   Preterm  0   AB  1   Living  0      SAB  1   IAB  0   Ectopic  0   Multiple  0   Live Births               Home Medications    Prior to Admission medications   Medication Sig Start Date End Date Taking? Authorizing Provider  clindamycin (CLEOCIN T) 1 % external solution Apply topically 2 (two) times daily.    [provider]  EPINEPHrine  0.3 mg/0.3 mL IJ SOAJ injection Inject 0.3 mg into the muscle as needed for anaphylaxis. 10/30/23   Kozlow, Camellia PARAS, MD  ibuprofen  (ADVIL ) 800 MG tablet Take 1 tablet (800 mg total) by mouth 3 (three) times daily. 10/06/23   Johnie Flaming A, NP  ondansetron  (ZOFRAN -ODT) 4 MG disintegrating tablet  Take 1 tablet (4 mg total) by mouth every 8 (eight) hours as needed for nausea or vomiting. 10/06/23   Johnie Flaming A, NP  polyethylene glycol (MIRALAX ) 17 g packet Take 17 g by mouth daily. Take while you are on narcotic pain medicine to prevent constipation 03/16/23   Maus, Jacob, MD  tretinoin (RETIN-A) 0.025 % cream Apply topically at bedtime.    [provider]    Family History Family History  Problem Relation Age of Onset   Sarcoidosis Mother    Hypertension Mother    Depression Mother    Anxiety disorder Mother    Prostate cancer Father    Cancer Sister    High Cholesterol Maternal Grandmother    Cancer Paternal Grandmother        lung?   Stomach cancer Neg Hx    Esophageal cancer Neg  Hx    Colon cancer Neg Hx     Social History Social History   Tobacco Use   Smoking status: Former    Current packs/day: 0.00    Types: Cigarettes    Quit date: 07/05/2011    Years since quitting: 12.3   Smokeless tobacco: Never  Vaping Use   Vaping status: Never Used  Substance Use Topics   Alcohol use: No   Drug use: Yes    Frequency: 35.0 times per week    Types: Marijuana     Allergies   Patient has no known allergies.   Review of Systems Review of Systems  Gastrointestinal:  Positive for abdominal pain.   Per HPI  Physical Exam Triage Vital Signs ED Triage Vitals [11/03/23 1513]  Encounter Vitals Group     BP 118/69     Girls Systolic BP Percentile      Girls Diastolic BP Percentile      Boys Systolic BP Percentile      Boys Diastolic BP Percentile      Pulse Rate 84     Resp 18     Temp 98.6 F (37 C)     Temp Source Oral     SpO2 100 %     Weight      Height      Head Circumference      Peak Flow      Pain Score 7     Pain Loc      Pain Education      Exclude from Growth Chart    No data found.  Updated Vital Signs BP 118/69 (BP Location: Right Arm)   Pulse 84   Temp 98.6 F (37 C) (Oral)   Resp 18   LMP 10/28/2023 (Exact Date)   SpO2 100%   Visual Acuity Right Eye Distance:   Left Eye Distance:   Bilateral Distance:    Right Eye Near:   Left Eye Near:    Bilateral Near:     Physical Exam Vitals and nursing note reviewed.  Constitutional:      General: She is awake. She is not in acute distress.    Appearance: Normal appearance. She is well-developed and well-groomed. She is ill-appearing. She is not toxic-appearing or diaphoretic.  Cardiovascular:     Rate and Rhythm: Normal rate and regular rhythm.  Pulmonary:     [place]: Pulmonary effort is normal.     Breath sounds: Normal breath sounds.  Abdominal:     General: Bowel sounds are normal. There is no distension.     Palpations: Abdomen is soft.  Tenderness: There is  abdominal tenderness in the right lower quadrant and left lower quadrant. There is no guarding or rebound.  Skin:    General: Skin is warm and dry.  Neurological:     Mental Status: She is alert.  Psychiatric:        Behavior: Behavior is cooperative.      UC Treatments / Results  Labs (all labs ordered are listed, but only abnormal results are displayed) Labs Reviewed  POCT URINALYSIS DIP (MANUAL ENTRY) - Abnormal; Notable for the following components:      Result Value   Bilirubin, UA small (*)    Ketones, POC UA moderate (40) (*)    Protein Ur, POC =30 (*)    Leukocytes, UA Trace (*)    All other components within normal limits  POCT URINE PREGNANCY    EKG   Radiology No results found.  Procedures Procedures (including critical care time)  Medications Ordered in UC Medications - No data to display  Initial Impression / Assessment and Plan / UC Course  I have reviewed the triage vital signs and the nursing notes.  Pertinent labs & imaging results that were available during my care of the patient were reviewed by me and considered in my medical decision making (see chart for details).     Patient is overall mildly ill-appearing.  TTP to right and left lower quadrants.  Without guarding or rebound tenderness.  Urinalysis revealed trace leukocytes, small bilirubin, moderate ketones, and trace protein.  UPT negative.  Recommended patient be seen in the emergency department due to ongoing and worsening symptoms with intermittent fever.  Patient is understanding and agreeable to plan at this time.  Patient is stable to arrive to the ER via POV. Final Clinical Impressions(s) / UC Diagnoses   Final diagnoses:  Lower abdominal pain  Nausea and vomiting, unspecified vomiting type     Discharge Instructions      Please read to the emergency department for further evaluation of your ongoing abdominal pain and nausea and vomiting.   ED Prescriptions   None     PDMP not reviewed this encounter.   Johnie Flaming A, NP 11/03/23 [phone]

## 2023-11-03 NOTE — Discharge Instructions (Signed)
 Please read to the emergency department for further evaluation of your ongoing abdominal pain and nausea and vomiting.

## 2023-11-03 NOTE — ED Provider Notes (Signed)
 [place] EMERGENCY DEPARTMENT AT [facility] Provider Note   CSN: [id number] Arrival date & time: 11/03/23  8371     Patient presents with: Abdominal Pain and Headache   Joan Rodriguez is a 28 y.o. female.   28 year old female with prior medical history as detailed below presents for evaluation.  Patient reports that on July 28 she had a hysterosalpingogram.  She has had persistent lower abdominal pain and intermittent vaginal bleeding since.  Procedure was performed by Dr. Ozell Dallas Rast.  Patient reports that she has been unable to follow-up with Dr. Rast since.  She denies fever.  She reports intermittent nausea and vomiting.  She denies vaginal discharge.  She reports intermittent vaginal bleeding.  She denies active bleeding now.  The history is provided by the patient and medical records.       Prior to Admission medications   Medication Sig Start Date End Date Taking? Authorizing Provider  clindamycin (CLEOCIN T) 1 % external solution Apply topically 2 (two) times daily.    [provider]  EPINEPHrine  0.3 mg/0.3 mL IJ SOAJ injection Inject 0.3 mg into the muscle as needed for anaphylaxis. 10/30/23   Kozlow, Camellia PARAS, MD  ibuprofen  (ADVIL ) 800 MG tablet Take 1 tablet (800 mg total) by mouth 3 (three) times daily. 10/06/23   Johnie Flaming A, NP  ondansetron  (ZOFRAN -ODT) 4 MG disintegrating tablet Take 1 tablet (4 mg total) by mouth every 8 (eight) hours as needed for nausea or vomiting. 10/06/23   Johnie Flaming A, NP  polyethylene glycol (MIRALAX ) 17 g packet Take 17 g by mouth daily. Take while you are on narcotic pain medicine to prevent constipation 03/16/23   Maus, Jacob, MD  tretinoin (RETIN-A) 0.025 % cream Apply topically at bedtime.    [provider]    Allergies: Patient has no known allergies.    Review of Systems  All other systems reviewed and are negative.   Updated Vital Signs BP 126/82   Pulse 71   Temp 98.2 F  (36.8 C)   Resp 14   Ht 4' 11 (1.499 m)   Wt 81.6 kg   LMP 10/28/2023 (Exact Date)   SpO2 100%   BMI 36.36 kg/m   Physical Exam Vitals and nursing note reviewed.  Constitutional:      General: She is not in acute distress.    Appearance: Normal appearance. She is well-developed.  HENT:     Head: Normocephalic and atraumatic.  Eyes:     Conjunctiva/sclera: Conjunctivae normal.     Pupils: Pupils are equal, round, and reactive to light.  Cardiovascular:     Rate and Rhythm: Normal rate and regular rhythm.     Heart sounds: Normal heart sounds.  Pulmonary:     [place]: Pulmonary effort is normal. No respiratory distress.     Breath sounds: Normal breath sounds.  Abdominal:     General: There is no distension.     Palpations: Abdomen is soft.     Tenderness: There is abdominal tenderness in the suprapubic area.  Musculoskeletal:        General: No deformity. Normal range of motion.     Cervical back: Normal range of motion and neck supple.  Skin:    General: Skin is warm and dry.  Neurological:     General: No focal deficit present.     Mental Status: She is alert and oriented to person, place, and time.     (all labs ordered  are listed, but only abnormal results are displayed) Labs Reviewed  COMPREHENSIVE METABOLIC PANEL WITH GFR - Abnormal; Notable for the following components:      Result Value   Total Protein 4.6 (*)    All other components within normal limits  URINALYSIS, ROUTINE W REFLEX MICROSCOPIC - Abnormal; Notable for the following components:   APPearance CLOUDY (*)    Leukocytes,Ua SMALL (*)    Bacteria, UA FEW (*)    All other components within normal limits  WET PREP, GENITAL  LIPASE, BLOOD  CBC  HCG, SERUM, QUALITATIVE  GC/CHLAMYDIA PROBE AMP ([place]) NOT AT [facility]    EKG: None  Radiology: No results found.   Procedures   Medications Ordered in the ED - No data to display                                  Medical Decision  Making Presents with complaint of diffuse lower abdominal pain.  This has been ongoing since her HSG was performed a month ago.  Patient is nontoxic in appearance.  Workup on the whole is without acute abnormality.  Patient reports longstanding history of intermittent BV.  She does have some clue cells on wet prep.  She denies significant vaginal discharge.  She is interested in a course of Flagyl  to treat possible early BV.  Importance of close follow-up with GYN stressed.  Strict return precautions given and understood.  Amount and/or Complexity of Data Reviewed Labs: ordered. Radiology: ordered.  Risk Prescription drug management.        Final diagnoses:  Generalized abdominal pain    ED Discharge Orders          Ordered    metroNIDAZOLE  (FLAGYL ) 500 MG tablet  2 times daily        11/03/23 2245               Laurice Maude BROCKS, MD 11/03/23 2247

## 2023-11-03 NOTE — Discharge Instructions (Signed)
 Return for any problem.  ?

## 2023-11-06 ENCOUNTER — Ambulatory Visit: Admitting: Allergy and Immunology

## 2023-11-06 LAB — GC/CHLAMYDIA PROBE AMP (~~LOC~~) NOT AT ARMC
Chlamydia: NEGATIVE
Comment: NEGATIVE
Comment: NORMAL
Neisseria Gonorrhea: NEGATIVE

## 2023-11-06 IMAGING — CT CT ABD-PELV W/ CM
2 of 4 series · 16 of 46 positions shown, 18 images · IV contrast (agent unspecified)
Comparison: Right upper quadrant ultrasound dated November 03, 2020.
CT abdomen pelvis dated August 14, 2006.

CLINICAL DATA: Right-sided abdominal pain for the past year.

EXAM:
CT ABDOMEN AND PELVIS WITH CONTRAST
TECHNIQUE: Multidetector CT imaging of the abdomen and pelvis was performed
using the standard protocol following bolus administration of
intravenous contrast.

[Series 3: abd/ pelvis 5.0 i30f 2 · axial · 0.98mm/px · z∈[+762,+1172]mm · 13 of 90 slices shown, 15 images]
[im 4/90  soft-tissue]
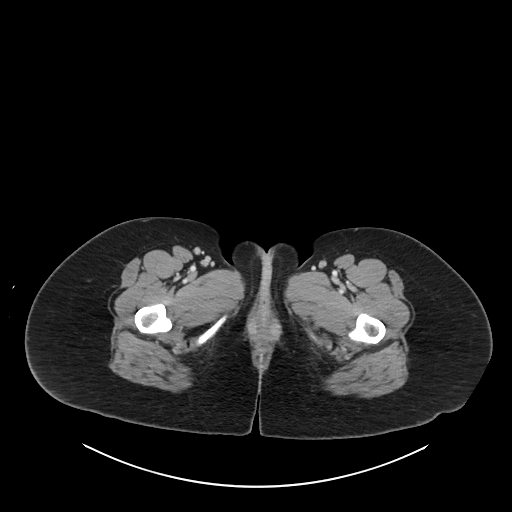
[im 4/90  bone]
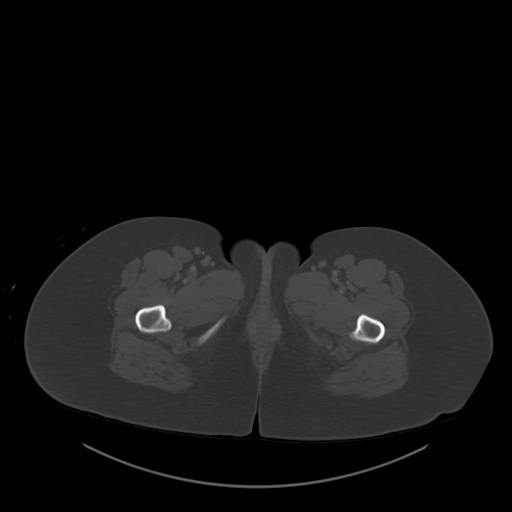
[im 11/90  soft-tissue]
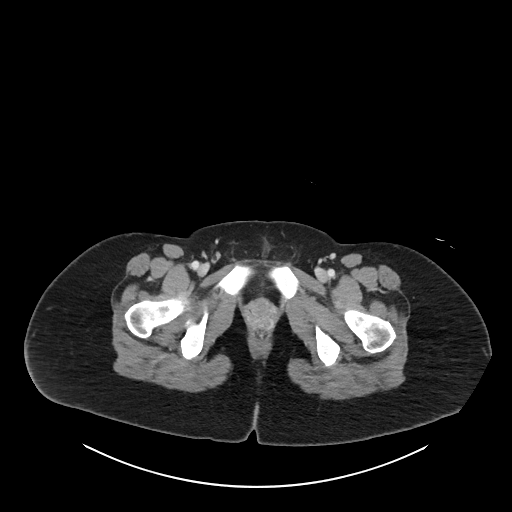
[im 18/90  soft-tissue]
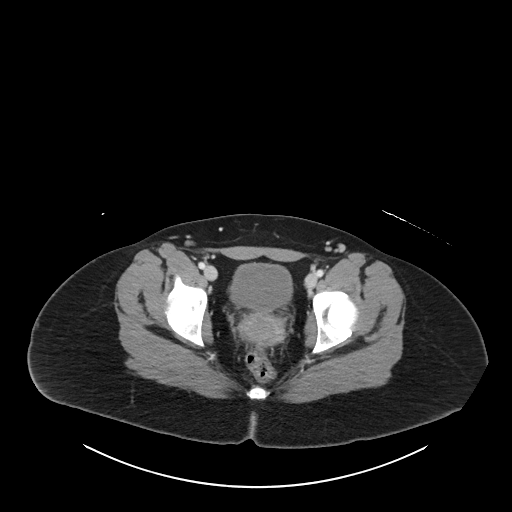
[im 25/90  soft-tissue]
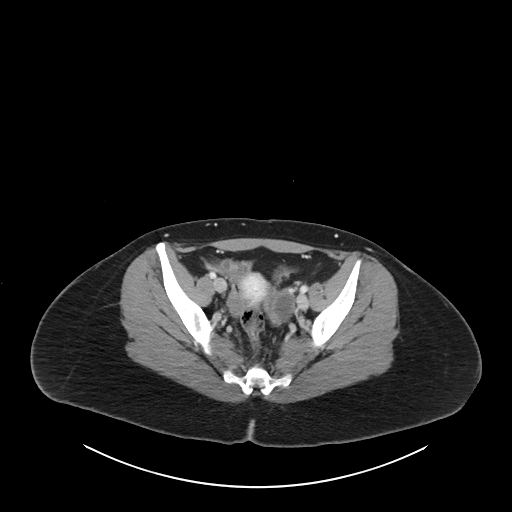
[im 33/90  soft-tissue]
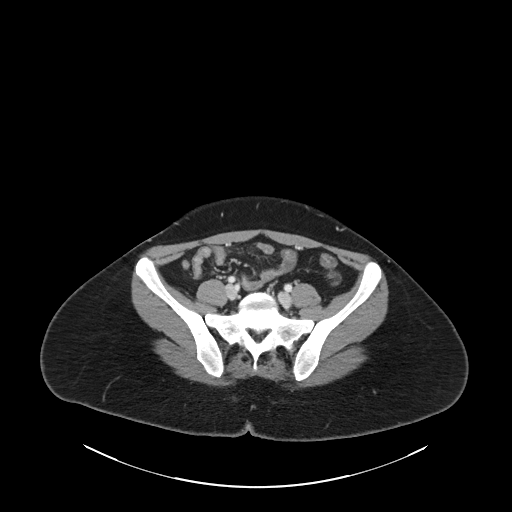
[im 40/90  soft-tissue]
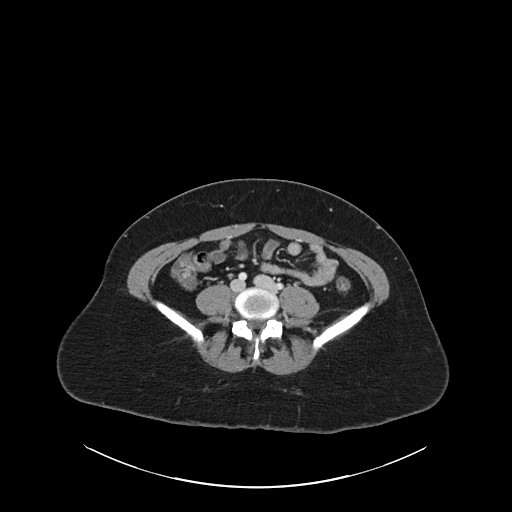
[im 47/90  soft-tissue]
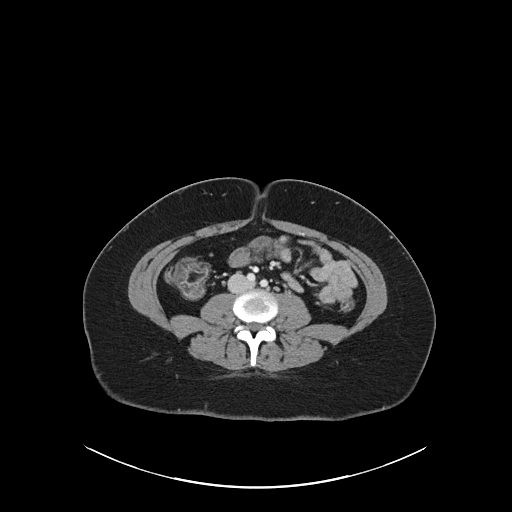
[im 50/90  soft-tissue]
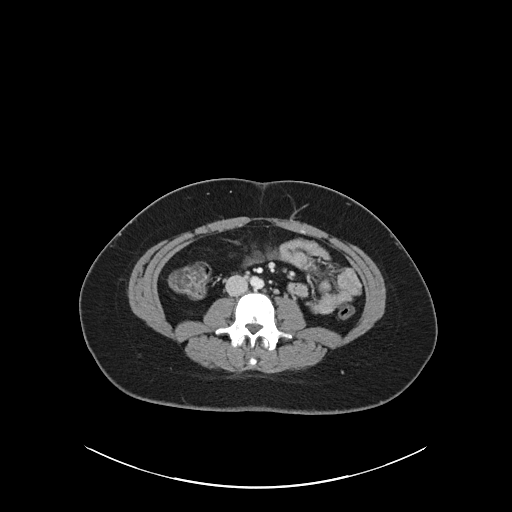
[im 57/90  soft-tissue]
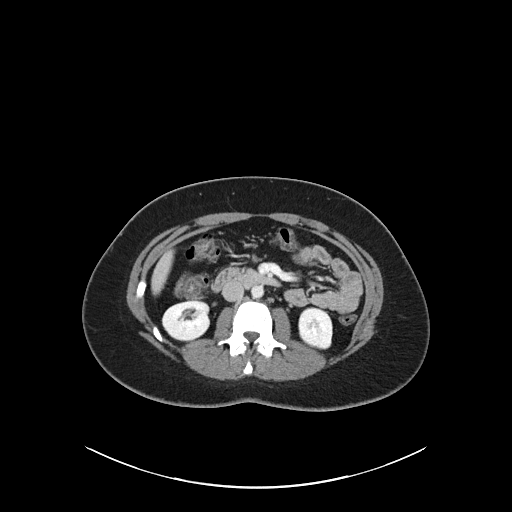
[im 57/90  bone]
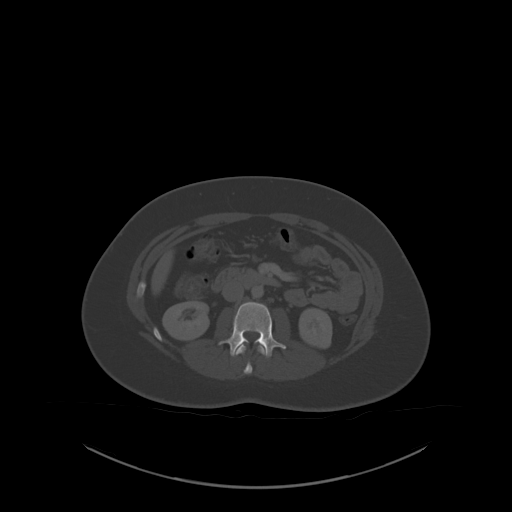
[im 65/90  soft-tissue]
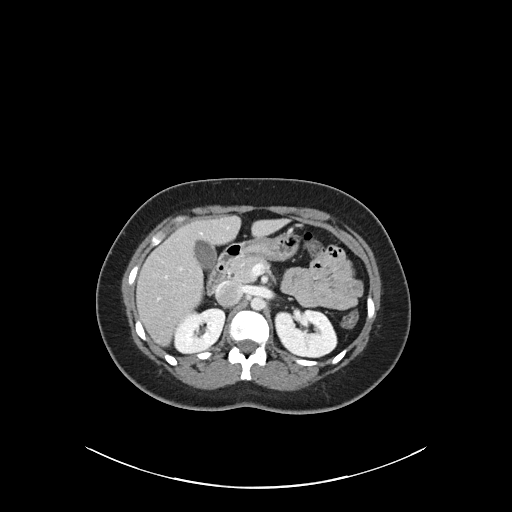
[im 72/90  soft-tissue]
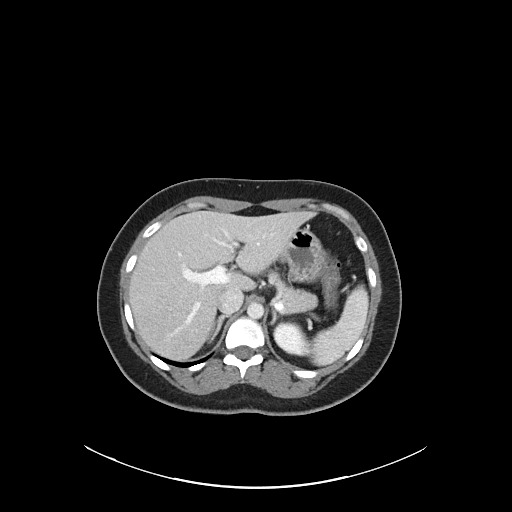
[im 79/90  soft-tissue]
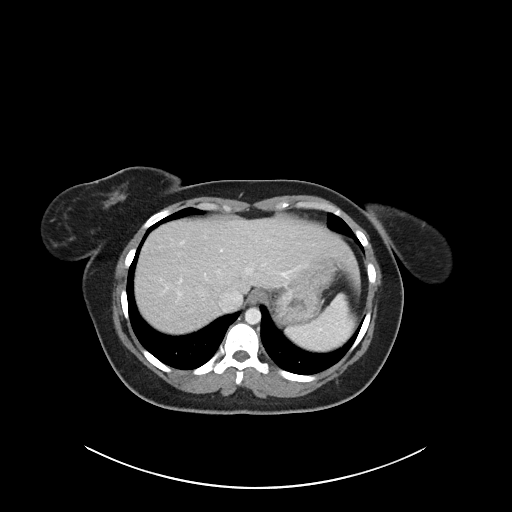
[im 86/90  soft-tissue]
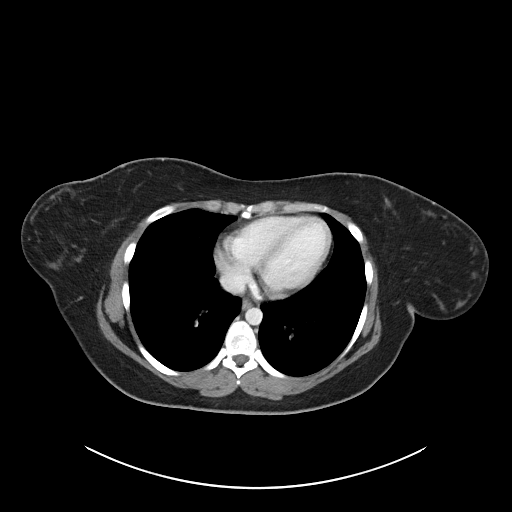

[Series 6: coronal soft tissue · coronal · 0.78mm/px · 3 of 101 slices shown]
[im 34/101  soft-tissue]
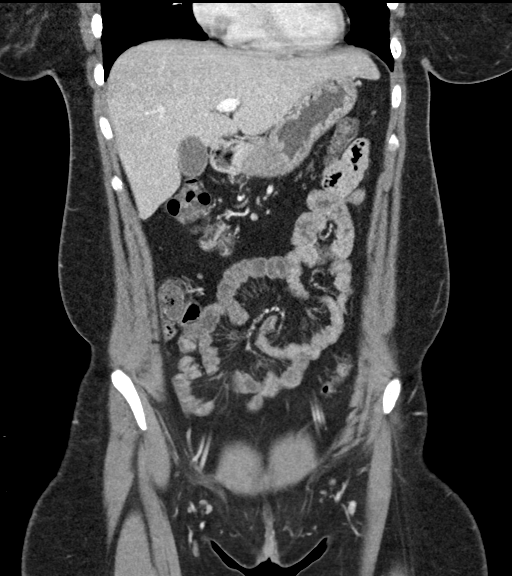
[im 45/101  soft-tissue]
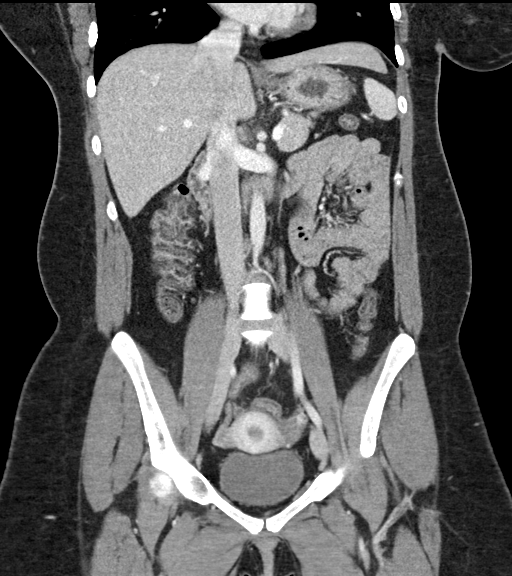
[im 56/101  soft-tissue]
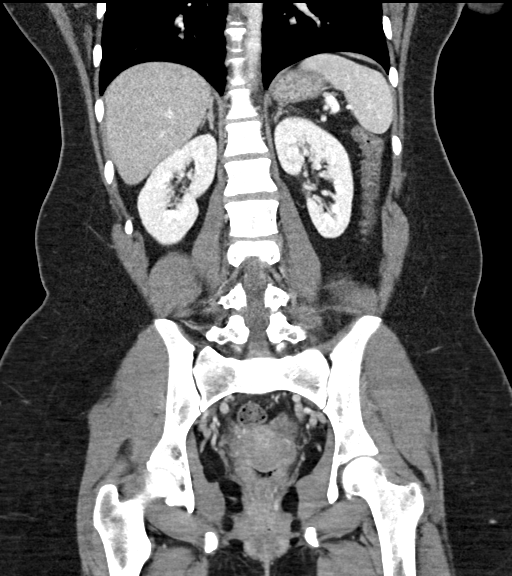

[16 of 46 positions shown; findings below may reference images not displayed]

RADIATION DOSE REDUCTION: This exam was performed according to the
departmental dose-optimization program which includes automated
exposure control, adjustment of the mA and/or kV according to
patient size and/or use of iterative reconstruction technique.

CONTRAST:  100mL OMNIPAQUE IOHEXOL 300 MG/ML  SOLN
FINDINGS: Lower chest: No acute abnormality.

Hepatobiliary: No focal liver abnormality is seen. No gallstones,
gallbladder wall thickening, or biliary dilatation.

Pancreas: Unremarkable. No pancreatic ductal dilatation or
surrounding inflammatory changes.

Spleen: Normal in size without focal abnormality.

Adrenals/Urinary Tract: Adrenal glands are unremarkable. Kidneys are
normal, without renal calculi, focal lesion, or hydronephrosis.
Bladder is unremarkable.

Stomach/Bowel: Stomach is within normal limits. Appendix appears
normal. No evidence of bowel wall thickening, distention, or
inflammatory changes. Diffuse submucosal fat deposition throughout
the colon.

Vascular/Lymphatic: No significant vascular findings are present. No
enlarged abdominal or pelvic lymph nodes.

Reproductive: The uterus and right ovary are unremarkable. 3.1 cm
simple appearing cyst in the left ovary. No follow-up imaging is
recommended.

Other: Tiny fat containing umbilical hernia. No free fluid or
pneumoperitoneum.

Musculoskeletal: No acute or significant osseous findings.
IMPRESSION: 1. No acute intra-abdominal process.
2. Diffuse submucosal fat deposition throughout the colon, usually
sequelae of prior inflammation or infection. Correlate for history
of inflammatory bowel disease.

## 2023-11-19 NOTE — Patient Instructions (Incomplete)
  1. Skin testing to food is Copy of skin test given  2. Epi-pen, benadryl (MD/ER evaluation) for allergic reaction  3. Continue nasal steroid and antihistamine during spring and fall  4. Influenza = Tamiflu. Covid = Paxlovid  5. Plan for fall flu vaccine

## 2023-11-20 ENCOUNTER — Encounter: Payer: Self-pay | Admitting: Family

## 2023-11-20 ENCOUNTER — Ambulatory Visit (INDEPENDENT_AMBULATORY_CARE_PROVIDER_SITE_OTHER): Admitting: Family

## 2023-11-20 DIAGNOSIS — J3089 Other allergic rhinitis: Secondary | ICD-10-CM | POA: Diagnosis not present

## 2023-11-20 DIAGNOSIS — T7840XD Allergy, unspecified, subsequent encounter: Secondary | ICD-10-CM

## 2023-11-20 DIAGNOSIS — J302 Other seasonal allergic rhinitis: Secondary | ICD-10-CM | POA: Diagnosis not present

## 2023-11-20 MED ORDER — CROMOLYN SODIUM 4 % OP SOLN: OPHTHALMIC | 5 refills | Status: AC

## 2023-11-20 NOTE — Progress Notes (Signed)
 Date of Service/Encounter:  11/20/23  Allergy  testing appointment   Initial visit on 10/30/23, seen for allergic reaction and seasonal allergic rhinitis due to pollen.  Please see that note for additional details.  She reports that she previously had an allergic reactionwith seafood a few years ago, but has since then had fish and shellfish a lot without any problems.  She reports that she is not avoiding any foods.  She is interested in getting skin testing to environmental allergens also. She has a dog and her boyfriend has a cat. She reports itchy eyes and feels like the dog or the cat is the culprit. She does not wear contacts.  Today reports for allergy  diagnostic testing:    DIAGNOSTICS:  Skin Testing: select foods Adequate positive and negative controls Results discussed with patient/family.  Airborne Adult Perc - 11/20/23 1400     Time Antigen Placed 1439    Allergen Manufacturer Jestine    Location Back    Number of Test 55    Panel 1 Select    1. Control-Buffer 50% Glycerol Negative    2. Control-Histamine 3+    3. Bahia Negative    4. French Southern Territories Negative    5. Johnson Negative    6. [place]  Blue Negative    7. Meadow Fescue Negative    8. Perennial Rye Negative    9. Timothy Negative    10. Ragweed Mix Negative    11. Cocklebur Negative    12. Plantain,  English Negative    13. Baccharis Negative    14. Dog Fennel Negative    15. Russian Thistle Negative    16. Lamb's Quarters Negative    17. Sheep Sorrell Negative    18. Rough Pigweed Negative    19. Marsh Elder, Rough Negative    20. Mugwort, Common Negative    21. Box, Elder Negative    22. Cedar, red Negative    23. Sweet Gum Negative    24. Pecan Pollen Negative    25. Pine Mix Negative    26. Walnut, Black Pollen Negative    27. Red Mulberry Negative    28. Ash Mix Negative    29. Birch Mix Negative    30. Beech American Negative    31. Cottonwood, Guinea-Bissau Negative    32. Hickory, White Negative     33. Maple Mix Negative    34. Oak, Guinea-[place] Mix Negative    35. Sycamore Eastern Negative    36. Alternaria Alternata Negative    37. Cladosporium Herbarum Negative    38. Aspergillus Mix Negative    39. Penicillium Mix Negative    40. Bipolaris Sorokiniana (Helminthosporium) Negative    41. Drechslera Spicifera (Curvularia) Negative    42. Mucor Plumbeus Negative    43. Fusarium Moniliforme Negative    44. Aureobasidium Pullulans (pullulara) Negative    45. Rhizopus Oryzae Negative    46. Botrytis Cinera Negative    47. Epicoccum Nigrum Negative    48. Phoma Betae Negative    49. Dust Mite Mix Negative    50. Cat Hair 10,000 BAU/ml Negative    51.  Dog Epithelia Negative    52. Mixed Feathers Negative    53. Horse Epithelia Negative    54. Cockroach, German Negative    55. Tobacco Leaf Negative          Intradermal - 11/20/23 1400     Time Antigen Placed 1441    Allergen Manufacturer Jestine    Location  Arm    Number of Test 16    Intradermal Select    Control Negative    Bahia Negative    French Southern Territories Negative    Johnson Negative    7 Grass Negative    Ragweed Mix Negative    Weed Mix Negative    Tree Mix Negative    Mold 1 2+    Mold 2 2+    Mold 3 Negative    Mold 4 Negative    Mite Mix Negative    Cat Negative    Dog 2+    Cockroach Negative          Food Adult Perc - 11/20/23 1400     Time Antigen Placed 1440    Allergen Manufacturer Greer    Location Back    Number of allergen test 12    8. Shellfish Mix Negative    9. Fish Mix Negative    18. Trout Negative    19. Tuna Negative    20. Salmon Negative    21. Flounder Negative    22. Codfish Negative    23. Shrimp Negative    24. Crab Negative    25. Lobster Negative    26. Oyster Negative    27. Scallops Negative             Allergy  testing results were read and interpreted by myself, documented by clinical staff.  Patient provided with copy of allergy  testing along with avoidance  measures when indicated.   1. Skin testing to  select foods  and environmental allergens is negative with adequate controls.  Intradermal skin testing is positive to mold mix 1, major mold mix 2, and dog She declines getting lab work to select foods since she eats them all the time without any problems.  Have access to your epinephrine  autoinjector device at all times. Copy of skin test given Start avoidance measures as below Start cromolyn  eye drops using 1 drop in each eye up to 4 times a day as needed for itchy watery eyes.  2. Epi-pen, benadryl (MD/ER evaluation) for allergic reaction  3. Continue nasal steroid and antihistamine during spring and fall  4. Influenza = Tamiflu. Covid = Paxlovid  5. Plan for fall flu vaccine  6. Follow up in 6-8 weeks or sooner if needed  Control of Dog or Cat Allergen Avoidance is the best way to manage a dog or cat allergy . If you have a dog or cat and are allergic to dog or cats, consider removing the dog or cat from the home. If you have a dog or cat but don't want to find it a new home, or if your family wants a pet even though someone in the household is allergic, here are some strategies that may help keep symptoms at bay:  Keep the pet out of your bedroom and restrict it to only a few rooms. Be advised that keeping the dog or cat in only one room will not limit the allergens to that room. Don't pet, hug or kiss the dog or cat; if you do, wash your hands with soap and water. High-efficiency particulate air (HEPA) cleaners run continuously in a bedroom or living room can reduce allergen levels over time. Regular use of a high-efficiency vacuum cleaner or a central vacuum can reduce allergen levels. Giving your dog or cat a bath at least once a week can reduce airborne allergen.  Control of Mold Allergen Mold and fungi can grow on a  variety of surfaces provided certain temperature and moisture conditions exist.  Outdoor molds grow on plants,  decaying vegetation and soil.  The major outdoor mold, Alternaria and Cladosporium, are found in very high numbers during hot and dry conditions.  Generally, a late Summer - Fall peak is seen for common outdoor fungal spores.  Rain will temporarily lower outdoor mold spore count, but counts rise rapidly when the rainy period ends.  The most important indoor molds are Aspergillus and Penicillium.  Dark, humid and poorly ventilated basements are ideal sites for mold growth.  The next most common sites of mold growth are the bathroom and the kitchen.  Outdoor [employer] Use air conditioning and keep windows closed Avoid exposure to decaying vegetation. Avoid leaf raking. Avoid grain handling. Consider wearing a face mask if working in moldy areas.  Indoor Mold Control Maintain humidity below 50%. Clean washable surfaces with 5% bleach solution. Remove sources e.g. Contaminated carpets.   Wanda Craze, FNP [facility] 

## 2023-11-30 ENCOUNTER — Encounter (HOSPITAL_COMMUNITY): Payer: Self-pay

## 2023-11-30 ENCOUNTER — Ambulatory Visit (HOSPITAL_COMMUNITY)
Admission: EM | Admit: 2023-11-30 | Discharge: 2023-11-30 | Disposition: A | Discharge status: Home / Self Care | Attending: Family Medicine | Admitting: Family Medicine

## 2023-11-30 DIAGNOSIS — B349 Viral infection, unspecified: Secondary | ICD-10-CM | POA: Diagnosis not present

## 2023-11-30 DIAGNOSIS — R112 Nausea with vomiting, unspecified: Secondary | ICD-10-CM | POA: Diagnosis not present

## 2023-11-30 LAB — POC COVID19/FLU A&B COMBO
Covid Antigen, POC: NEGATIVE
Influenza A Antigen, POC: NEGATIVE
Influenza B Antigen, POC: NEGATIVE

## 2023-11-30 MED ORDER — ONDANSETRON 4 MG PO TBDP: 4.0000 mg | ORAL_TABLET | Freq: Once | ORAL | Status: DC

## 2023-11-30 MED ORDER — ONDANSETRON 4 MG PO TBDP: 4.0000 mg | ORAL_TABLET | Freq: Three times a day (TID) | ORAL | 0 refills | Status: AC | PRN

## 2023-11-30 MED ORDER — ONDANSETRON 4 MG PO TBDP
ORAL_TABLET | ORAL | Status: AC
  Filled 2023-11-30: qty 1

## 2023-11-30 NOTE — Discharge Instructions (Signed)
 You tested negative for COVID and flu.  Take Zofran  every 8 hours as needed for nausea or vomiting.  You were given a dose while in the clinic.  Send rest/hydration and electrolyte replacement with Gatorade, Powerade, Pedialyte, water.  Bland diet and advance as you tolerate.  Follow-up with your PCP if your symptoms do not improve.  Please go to the ER for any worsening symptoms.  I hope you feel better soon!

## 2023-11-30 NOTE — ED Provider Notes (Signed)
 [facility]    CSN: [id number] Arrival date & time: 11/30/23  1309      History   Chief Complaint Chief Complaint  Patient presents with   Headache   Emesis    HPI Joan Rodriguez is a 28 y.o. female  presents for evaluation of URI symptoms for 1 days. Patient reports associated symptoms of cough, congestion, headache, non-bloody nonbilious vomiting and diarrhea, fevers. Denies sore throat, ear pain, shortness of breath.  Patient does not have a hx of asthma. Patient smokes marijuana.  Reports past has similar symptoms.  Pt has taken Tylenol  and ibuprofen  OTC for symptoms.  States she took a home COVID/flu test and was positive for flu A and B.  Pt has no other concerns at this time.     Headache Associated symptoms: congestion, cough, diarrhea, fever, nausea and vomiting   Emesis Associated symptoms: cough, diarrhea, fever and headaches     Past Medical History:  Diagnosis Date   ADHD (attention deficit hyperactivity disorder)    Anxiety    Depressed    Depression    Oppositional defiant disorder    Polysubstance use disorder    PTSD (post-traumatic stress disorder)     Patient Active Problem List   Diagnosis Date Noted   Major depression, recurrent 01/07/2019   Abnormal uterine bleeding (AUB) 11/26/2013   BV (bacterial vaginosis) 11/26/2013   Polysubstance dependence (HCC) 08/14/2011   Oppositional defiant disorder 08/14/2011   MDD (major depressive disorder) 08/12/2011   ADHD (attention deficit hyperactivity disorder), 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 Authorizing Provider  ondansetron  (ZOFRAN -ODT) 4 MG disintegrating tablet Take 1 tablet (4 mg total) by mouth every 8 (eight) hours as needed for nausea or vomiting. 11/30/23  Yes Joshiah Traynham, Jodi R, NP  clindamycin (CLEOCIN T) 1 % external solution Apply topically 2 (two) times daily.    [provider]  cromolyn  (OPTICROM ) 4 % ophthalmic solution Place 1 drop in each eye up to 4 times a day as needed for itchy watery eyes Patient not taking: Reported on 11/30/2023 11/20/23   Cheryl Reusing, FNP  EPINEPHrine  0.3 mg/0.3 mL IJ SOAJ injection Inject 0.3 mg into the muscle as needed for anaphylaxis. 10/30/23   Kozlow, Camellia PARAS, MD  ibuprofen  (ADVIL ) 800 MG tablet Take 1 tablet (800 mg total) by mouth 3 (three) times daily. 10/06/23   Johnie Flaming A, NP  metroNIDAZOLE  (FLAGYL ) 500 MG tablet Take 1 tablet (500 mg total) by mouth 2 (two) times daily. Patient not taking: Reported on 11/30/2023 11/03/23   Laurice Maude BROCKS, MD  polyethylene glycol (MIRALAX ) 17 g packet Take 17 g by mouth daily. Take while you are on narcotic pain medicine to prevent  constipation Patient not taking: Reported on 11/30/2023 03/16/23   Maus, Jacob, MD  tretinoin (RETIN-A) 0.025 % cream Apply topically at bedtime.    [provider]    Family History Family History  Problem Relation Age of Onset   Sarcoidosis Mother    Hypertension Mother    Depression Mother    Anxiety disorder Mother    Prostate cancer Father    Cancer Sister    High Cholesterol Maternal Grandmother    Cancer Paternal Grandmother        lung?   Stomach cancer Neg Hx    Esophageal cancer Neg Hx    Colon cancer Neg Hx     Social History Social History   Tobacco Use   Smoking status: Former    Current packs/day: 0.00    Types: Cigarettes    Quit date: 07/05/2011    Years since quitting: 12.4   Smokeless  tobacco: Never  Vaping Use   Vaping status: Never Used  Substance Use Topics   Alcohol use: No   Drug use: Yes    Frequency: 35.0 times per week    Types: Marijuana     Allergies   Patient has no known allergies.   Review of Systems Review of Systems  Constitutional:  Positive for fever.  HENT:  Positive for congestion.   Respiratory:  Positive for cough.   Gastrointestinal:  Positive for diarrhea, nausea and vomiting.  Neurological:  Positive for headaches.     Physical Exam Triage Vital Signs ED Triage Vitals [11/30/23 1328]  Encounter Vitals Group     BP 104/68     Girls Systolic BP Percentile      Girls Diastolic BP Percentile      Boys Systolic BP Percentile      Boys Diastolic BP Percentile      Pulse Rate 76     Resp 14     Temp 98 F (36.7 C)     Temp Source Oral     SpO2 96 %     Weight      Height      Head Circumference      Peak Flow      Pain Score 10     Pain Loc      Pain Education      Exclude from Growth Chart    No data found.  Updated Vital Signs BP 104/68 (BP Location: Left Arm)   Pulse 76   Temp 98 F (36.7 C) (Oral)   Resp 14   LMP 10/28/2023 (Exact Date)   SpO2 96%   Visual Acuity Right Eye Distance:   Left Eye Distance:   Bilateral Distance:    Right Eye Near:   Left Eye Near:    Bilateral Near:     Physical Exam Vitals and nursing note reviewed.  Constitutional:      General: She is not in acute distress.    Appearance: She is well-developed. She is not ill-appearing.  HENT:     Head: Normocephalic and atraumatic.     Right Ear: Tympanic membrane and ear canal normal.     Left Ear: Tympanic membrane and ear canal normal.     Nose: Congestion present.     Mouth/Throat:     Mouth: Mucous membranes are moist.     Pharynx: Oropharynx is clear. Uvula midline. No oropharyngeal exudate or posterior oropharyngeal erythema.     Tonsils: No tonsillar exudate or tonsillar abscesses.  Eyes:  Conjunctiva/sclera:  Conjunctivae normal.     Pupils: Pupils are equal, round, and reactive to light.  Cardiovascular:     Rate and Rhythm: Normal rate and regular rhythm.     Heart sounds: Normal heart sounds.  Pulmonary:     Effort: Pulmonary effort is normal.     Breath sounds: Normal breath sounds. No wheezing or rhonchi.  Musculoskeletal:     Cervical back: Normal range of motion and neck supple.  Lymphadenopathy:     Cervical: No cervical adenopathy.  Skin:    General: Skin is warm and dry.  Neurological:     General: No focal deficit present.     Mental Status: She is alert and oriented to person, place, and time.  Psychiatric:        Mood and Affect: Mood normal.        Behavior: Behavior normal.      UC Treatments / Results  Labs (all labs ordered are listed, but only abnormal results are displayed) Labs Reviewed  POC COVID19/FLU A&B COMBO    EKG   Radiology No results found.  Procedures Procedures (including critical care time)  Medications Ordered in UC Medications  ondansetron  (ZOFRAN -ODT) disintegrating tablet 4 mg (has no administration in time range)    Initial Impression / Assessment and Plan / UC Course  I have reviewed the triage vital signs and the nursing notes.  Pertinent labs & imaging results that were available during my care of the patient were reviewed by me and considered in my medical decision making (see chart for details).     Reviewed exam and symptoms with patient.  Negative COVID and flu testing.  Discussed viral illness and symptomatic treatment.  Patient given Zofran  in clinic and Rx sent to pharmacy.  Discussed hydration/electrolyte replacement and bland diet.  PCP follow-up if symptoms do not improve.  ER precautions reviewed. Final Clinical Impressions(s) / UC Diagnoses   Final diagnoses:  Nausea and vomiting, unspecified vomiting type  Viral illness     Discharge Instructions      You tested negative for COVID and flu.  Take Zofran   every 8 hours as needed for nausea or vomiting.  You were given a dose while in the clinic.  Send rest/hydration and electrolyte replacement with Gatorade, Powerade, Pedialyte, water.  Bland diet and advance as you tolerate.  Follow-up with your PCP if your symptoms do not improve.  Please go to the ER for any worsening symptoms.  I hope you feel better soon!     ED Prescriptions     Medication Sig Dispense Auth. Provider   ondansetron  (ZOFRAN -ODT) 4 MG disintegrating tablet Take 1 tablet (4 mg total) by mouth every 8 (eight) hours as needed for nausea or vomiting. 10 tablet Maciah Schweigert, Jodi R, NP      PDMP not reviewed this encounter.   Loreda Myla SAUNDERS, NP 11/30/23 [phone]

## 2023-11-30 NOTE — ED Triage Notes (Signed)
 Patient reports that she has had a headache and vomiting since yesterday. Patient states, I took a 3 in 1 test and I am positive for flu A and B.  Patient states she has taken Ibuprofen  for her headache .

## 2023-12-05 ENCOUNTER — Encounter (HOSPITAL_COMMUNITY): Payer: Self-pay | Admitting: *Deleted

## 2023-12-05 ENCOUNTER — Emergency Department (HOSPITAL_COMMUNITY)
Admission: EM | Admit: 2023-12-05 | Discharge: 2023-12-06 | Disposition: A | Discharge status: Home / Self Care | Attending: Emergency Medicine | Admitting: Emergency Medicine

## 2023-12-05 ENCOUNTER — Other Ambulatory Visit: Payer: Self-pay

## 2023-12-05 DIAGNOSIS — R109 Unspecified abdominal pain: Secondary | ICD-10-CM | POA: Insufficient documentation

## 2023-12-05 DIAGNOSIS — B349 Viral infection, unspecified: Secondary | ICD-10-CM | POA: Diagnosis not present

## 2023-12-05 DIAGNOSIS — R197 Diarrhea, unspecified: Secondary | ICD-10-CM | POA: Diagnosis not present

## 2023-12-05 DIAGNOSIS — R059 Cough, unspecified: Secondary | ICD-10-CM | POA: Diagnosis present

## 2023-12-05 DIAGNOSIS — R1110 Vomiting, unspecified: Secondary | ICD-10-CM | POA: Insufficient documentation

## 2023-12-05 DIAGNOSIS — R519 Headache, unspecified: Secondary | ICD-10-CM | POA: Insufficient documentation

## 2023-12-05 LAB — URINALYSIS, ROUTINE W REFLEX MICROSCOPIC
Bilirubin Urine: NEGATIVE
Glucose, UA: NEGATIVE mg/dL
Hgb urine dipstick: NEGATIVE
Ketones, ur: 20 mg/dL — AB
Leukocytes,Ua: NEGATIVE
Nitrite: NEGATIVE
Protein, ur: 30 mg/dL — AB
Specific Gravity, Urine: 1.028 (ref 1.005–1.030)
pH: 5 (ref 5.0–8.0)

## 2023-12-05 LAB — RESP PANEL BY RT-PCR (RSV, FLU A&B, COVID)  RVPGX2
Influenza A by PCR: NEGATIVE
Influenza B by PCR: NEGATIVE
Resp Syncytial Virus by PCR: NEGATIVE
SARS Coronavirus 2 by RT PCR: NEGATIVE

## 2023-12-05 LAB — COMPREHENSIVE METABOLIC PANEL WITH GFR
ALT: 20 U/L (ref 0–44)
AST: 19 U/L (ref 15–41)
Albumin: 4.2 g/dL (ref 3.5–5.0)
Alkaline Phosphatase: 90 U/L (ref 38–126)
Anion gap: 13 (ref 5–15)
BUN: 6 mg/dL (ref 6–20)
CO2: 24 mmol/L (ref 22–32)
Calcium: 9.5 mg/dL (ref 8.9–10.3)
Chloride: 101 mmol/L (ref 98–111)
Creatinine, Ser: 0.81 mg/dL (ref 0.44–1.00)
GFR, Estimated: >60 mL/min (ref >60)
Glucose, Bld: 96 mg/dL (ref 70–99)
Potassium: 3.5 mmol/L (ref 3.5–5.1)
Sodium: 138 mmol/L (ref 135–145)
Total Bilirubin: 0.9 mg/dL (ref 0.0–1.2)
Total Protein: 7.9 g/dL (ref 6.5–8.1)

## 2023-12-05 LAB — CBC
HCT: 46.5 % — ABNORMAL HIGH (ref 36.0–46.0)
Hemoglobin: 15.7 g/dL — ABNORMAL HIGH (ref 12.0–15.0)
MCH: 30.3 pg (ref 26.0–34.0)
MCHC: 33.8 g/dL (ref 30.0–36.0)
MCV: 89.6 fL (ref 80.0–100.0)
Platelets: 448 K/uL — ABNORMAL HIGH (ref 150–400)
RBC: 5.19 MIL/uL — ABNORMAL HIGH (ref 3.87–5.11)
RDW: 12.7 % (ref 11.5–15.5)
WBC: 16 K/uL — ABNORMAL HIGH (ref 4.0–10.5)
nRBC: 0 % (ref 0.0–0.2)

## 2023-12-05 LAB — LIPASE, BLOOD: Lipase: 95 U/L — ABNORMAL HIGH (ref 11–51)

## 2023-12-05 LAB — HCG, SERUM, QUALITATIVE: Preg, Serum: NEGATIVE

## 2023-12-05 MED ORDER — ACETAMINOPHEN 325 MG PO TABS
650.0000 mg | ORAL_TABLET | Freq: Once | ORAL | Status: AC
  Administered 2023-12-05: 650 mg via ORAL

## 2023-12-05 MED ORDER — ACETAMINOPHEN 325 MG PO TABS
ORAL_TABLET | ORAL | Status: AC
  Filled 2023-12-05: qty 2

## 2023-12-05 NOTE — ED Triage Notes (Signed)
 Pt arrives via POV. Pt report abdominal pain, nausea, vomiting, headache, and diarrhea for over a week. Pt AxOx4.

## 2023-12-06 ENCOUNTER — Emergency Department (HOSPITAL_COMMUNITY)

## 2023-12-06 MED ORDER — ONDANSETRON 8 MG PO TBDP: ORAL_TABLET | ORAL | 0 refills | Status: AC

## 2023-12-06 MED ORDER — DICYCLOMINE HCL 20 MG PO TABS: 20.0000 mg | ORAL_TABLET | Freq: Two times a day (BID) | ORAL | 0 refills | Status: AC

## 2023-12-06 MED ORDER — KETOROLAC TROMETHAMINE 60 MG/2ML IM SOLN
60.0000 mg | Freq: Once | INTRAMUSCULAR | Status: AC
  Administered 2023-12-06: 60 mg via INTRAMUSCULAR
  Filled 2023-12-06: qty 2

## 2023-12-06 MED ORDER — ONDANSETRON 4 MG PO TBDP
8.0000 mg | ORAL_TABLET | Freq: Once | ORAL | Status: AC
  Administered 2023-12-06: 8 mg via ORAL
  Filled 2023-12-06: qty 2

## 2023-12-06 NOTE — ED Provider Notes (Signed)
 [place] EMERGENCY DEPARTMENT AT [facility] Provider Note   CSN: [id number] Arrival date & time: 12/05/23  8166     Patient presents with: Abdominal Pain, Emesis, and Headache   Joan Rodriguez is a 28 y.o. female.   The history is provided by the patient.  URI Presenting symptoms: cough, fatigue and rhinorrhea   Presenting symptoms: no fever   Severity:  Moderate Onset quality:  Gradual Duration:  2 weeks Timing:  Constant Progression:  Unchanged Chronicity:  New Relieved by:  Nothing Worsened by:  Nothing Ineffective treatments:  None tried Risk factors: not elderly   Also nausea vomiting and diarrhea and abdominal cramping with this.  No fevers.       Prior to Admission medications   Medication Sig Start Date End Date Taking? Authorizing Provider  dicyclomine (BENTYL) 20 MG tablet Take 1 tablet (20 mg total) by mouth 2 (two) times daily. 12/06/23  Yes Ravon Mcilhenny, MD  ondansetron  (ZOFRAN -ODT) 8 MG disintegrating tablet 8mg  ODT q8 hours prn nausea 12/06/23  Yes Ruchel Brandenburger, MD  clindamycin (CLEOCIN T) 1 % external solution Apply topically 2 (two) times daily.    [provider]  cromolyn  (OPTICROM ) 4 % ophthalmic solution Place 1 drop in each eye up to 4 times a day as needed for itchy watery eyes Patient not taking: Reported on 11/30/2023 11/20/23   Cheryl Reusing, FNP  EPINEPHrine  0.3 mg/0.3 mL IJ SOAJ injection Inject 0.3 mg into the muscle as needed for anaphylaxis. 10/30/23   Kozlow, Camellia PARAS, MD  ibuprofen  (ADVIL ) 800 MG tablet Take 1 tablet (800 mg total) by mouth 3 (three) times daily. 10/06/23   Johnie Flaming A, NP  metroNIDAZOLE  (FLAGYL ) 500 MG tablet Take 1 tablet (500 mg total) by mouth 2 (two) times daily. Patient not taking: Reported on 11/30/2023 11/03/23   Laurice Maude BROCKS, MD  ondansetron  (ZOFRAN -ODT) 4 MG disintegrating tablet Take 1 tablet (4 mg total) by mouth every 8 (eight) hours as needed for nausea or vomiting. 11/30/23    Mayer, Jodi R, NP  polyethylene glycol (MIRALAX ) 17 g packet Take 17 g by mouth daily. Take while you are on narcotic pain medicine to prevent constipation Patient not taking: Reported on 11/30/2023 03/16/23   Maus, Jacob, MD  tretinoin (RETIN-A) 0.025 % cream Apply topically at bedtime.    [provider]    Allergies: Patient has no known allergies.    Review of Systems  Constitutional:  Positive for fatigue. Negative for fever.  HENT:  Positive for rhinorrhea.   Respiratory:  Positive for cough. Negative for shortness of breath.   Cardiovascular:  Negative for chest pain and leg swelling.  Gastrointestinal:  Positive for diarrhea, nausea and vomiting.  All other systems reviewed and are negative.   Updated Vital Signs BP 108/62   Pulse (!) 50   Temp 98.7 F (37.1 C)   Resp 16   Ht 4' 11 (1.499 m)   Wt 81.6 kg   LMP 11/25/2023   SpO2 99%   BMI 36.33 kg/m   Physical Exam Vitals and nursing note reviewed.  Constitutional:      General: She is not in acute distress.    Appearance: Normal appearance. She is well-developed.  HENT:     Head: Normocephalic and atraumatic.     Nose: Nose normal.     Mouth/Throat:     Mouth: Mucous membranes are moist.     Pharynx: Oropharynx is clear.  Eyes:  Extraocular Movements: Extraocular movements intact.     Pupils: Pupils are equal, round, and reactive to light.  Cardiovascular:     Rate and Rhythm: Normal rate and regular rhythm.     Pulses: Normal pulses.     Heart sounds: Normal heart sounds.  Pulmonary:     Effort: Pulmonary effort is normal. No respiratory distress.     Breath sounds: Normal breath sounds.  Abdominal:     General: Bowel sounds are normal. There is no distension.     Palpations: Abdomen is soft. There is no mass.     Tenderness: There is no abdominal tenderness. There is no guarding or rebound.     Hernia: No hernia is present.  Musculoskeletal:        General: Normal range of motion.      Cervical back: Neck supple.  Skin:    General: Skin is warm and dry.     Capillary Refill: Capillary refill takes less than 2 seconds.     Findings: No erythema or rash.  Neurological:     General: No focal deficit present.     Mental Status: She is alert and oriented to person, place, and time.     Deep Tendon Reflexes: Reflexes normal.  Psychiatric:        Mood and Affect: Mood normal.     (all labs ordered are listed, but only abnormal results are displayed) Results for orders placed or performed during the hospital encounter of 12/05/23  Urinalysis, Routine w reflex microscopic -Urine, Clean Catch   Collection Time: 12/05/23  7:47 PM  Result Value Ref Range   Color, Urine AMBER (A) YELLOW   APPearance CLOUDY (A) CLEAR   Specific Gravity, Urine 1.028 1.005 - 1.030   pH 5.0 5.0 - 8.0   Glucose, UA NEGATIVE NEGATIVE mg/dL   Hgb urine dipstick NEGATIVE NEGATIVE   Bilirubin Urine NEGATIVE NEGATIVE   Ketones, ur 20 (A) NEGATIVE mg/dL   Protein, ur 30 (A) NEGATIVE mg/dL   Nitrite NEGATIVE NEGATIVE   Leukocytes,Ua NEGATIVE NEGATIVE   RBC / HPF 0-5 0 - 5 RBC/hpf   WBC, UA 0-5 0 - 5 WBC/hpf   Bacteria, UA RARE (A) NONE SEEN   Squamous Epithelial / HPF 11-20 0 - 5 /HPF   Mucus PRESENT   Resp panel by RT-PCR (RSV, Flu A&B, Covid) Anterior Nasal Swab   Collection Time: 12/05/23  7:48 PM   Specimen: Anterior Nasal Swab  Result Value Ref Range   SARS Coronavirus 2 by RT PCR NEGATIVE NEGATIVE   Influenza A by PCR NEGATIVE NEGATIVE   Influenza B by PCR NEGATIVE NEGATIVE   Resp Syncytial Virus by PCR NEGATIVE NEGATIVE  Lipase, blood   Collection Time: 12/05/23  8:11 PM  Result Value Ref Range   Lipase 95 (H) 11 - 51 U/L  Comprehensive metabolic panel   Collection Time: 12/05/23  8:11 PM  Result Value Ref Range   Sodium 138 135 - 145 mmol/L   Potassium 3.5 3.5 - 5.1 mmol/L   Chloride 101 98 - 111 mmol/L   CO2 24 22 - 32 mmol/L   Glucose, Bld 96 70 - 99 mg/dL   BUN 6 6 - 20  mg/dL   Creatinine, Ser 9.18 0.44 - 1.00 mg/dL   Calcium 9.5 8.9 - 89.6 mg/dL   Total Protein 7.9 6.5 - 8.1 g/dL   Albumin 4.2 3.5 - 5.0 g/dL   AST 19 15 - 41 U/L   ALT 20  0 - 44 U/L   Alkaline Phosphatase 90 38 - 126 U/L   Total Bilirubin 0.9 0.0 - 1.2 mg/dL   GFR, Estimated >39 >39 mL/min   Anion gap 13 5 - 15  CBC   Collection Time: 12/05/23  8:11 PM  Result Value Ref Range   WBC 16.0 (H) 4.0 - 10.5 K/uL   RBC 5.19 (H) 3.87 - 5.11 MIL/uL   Hemoglobin 15.7 (H) 12.0 - 15.0 g/dL   HCT 53.4 (H) 63.9 - 53.9 %   MCV 89.6 80.0 - 100.0 fL   MCH 30.3 26.0 - 34.0 pg   MCHC 33.8 30.0 - 36.0 g/dL   RDW 87.2 88.4 - 84.4 %   Platelets 448 (H) 150 - 400 K/uL   nRBC 0.0 0.0 - 0.2 %  hCG, serum, qualitative   Collection Time: 12/05/23  8:11 PM  Result Value Ref Range   Preg, Serum NEGATIVE NEGATIVE   CT Renal Stone Study Result Date: 12/06/2023 CLINICAL DATA:  Abdominal and flank pain. Nausea and vomiting. Diarrhea. EXAM: CT ABDOMEN AND PELVIS WITHOUT CONTRAST TECHNIQUE: Multidetector CT imaging of the abdomen and pelvis was performed following the standard protocol without IV contrast. RADIATION DOSE REDUCTION: This exam was performed according to the departmental dose-optimization program which includes automated exposure control, adjustment of the mA and/or kV according to patient size and/or use of iterative reconstruction technique. COMPARISON:  11/03/2023 FINDINGS: Lower chest: Unremarkable. Hepatobiliary: No suspicious focal abnormality in the liver on this study without intravenous contrast. There is no evidence for gallstones, gallbladder wall thickening, or pericholecystic fluid. No intrahepatic or extrahepatic biliary dilation. Pancreas: No focal mass lesion. No dilatation of the main duct. No intraparenchymal cyst. No peripancreatic edema. Spleen: No splenomegaly. No suspicious focal mass lesion. Adrenals/Urinary Tract: No adrenal nodule or mass. No renal stone disease. Subtle increased  density noted in the papillae which could reflect concentrated urine or subtle changes of medullary nephrocalcinosis. No evidence for hydroureter. The urinary bladder appears normal for the degree of distention. Stomach/Bowel: Stomach is unremarkable. No gastric wall thickening. No evidence of outlet obstruction. Duodenum is normally positioned as is the ligament of Treitz. No small bowel wall thickening. No small bowel dilatation. The terminal ileum is normal. The appendix is normal. No gross colonic mass. Colon is diffusely decompressed which accentuates wall thickness. Vascular/Lymphatic: No abdominal aortic aneurysm. No abdominal aortic atherosclerotic calcification. There is no gastrohepatic or hepatoduodenal ligament lymphadenopathy. No retroperitoneal or mesenteric lymphadenopathy. No pelvic sidewall lymphadenopathy. Reproductive: The uterus is unremarkable.  There is no adnexal mass. Other: No intraperitoneal free fluid. Musculoskeletal: No worrisome lytic or sclerotic osseous abnormality. IMPRESSION: 1. No acute findings in the abdomen or pelvis. Specifically, no findings to explain the patient's history of abdominal pain and vomiting. Electronically Signed   By: Camellia Candle M.D.   On: 12/06/2023 05:32   DG Chest Portable 1 View Result Date: 12/06/2023 CLINICAL DATA:  Cough. EXAM: PORTABLE CHEST 1 VIEW COMPARISON:  10/02/2014 FINDINGS: Low volume film. The lungs are clear without focal pneumonia, edema, pneumothorax or pleural effusion. Cardiopericardial silhouette is at upper limits of normal for size. No acute bony abnormality. IMPRESSION: No active disease. Electronically Signed   By: Camellia Candle M.D.   On: 12/06/2023 05:26    Radiology: CT Renal Stone Study Result Date: 12/06/2023 CLINICAL DATA:  Abdominal and flank pain. Nausea and vomiting. Diarrhea. EXAM: CT ABDOMEN AND PELVIS WITHOUT CONTRAST TECHNIQUE: Multidetector CT imaging of the abdomen and pelvis was performed following the  standard protocol without IV contrast. RADIATION DOSE REDUCTION: This exam was performed according to the departmental dose-optimization program which includes automated exposure control, adjustment of the mA and/or kV according to patient size and/or use of iterative reconstruction technique. COMPARISON:  11/03/2023 FINDINGS: Lower chest: Unremarkable. Hepatobiliary: No suspicious focal abnormality in the liver on this study without intravenous contrast. There is no evidence for gallstones, gallbladder wall thickening, or pericholecystic fluid. No intrahepatic or extrahepatic biliary dilation. Pancreas: No focal mass lesion. No dilatation of the main duct. No intraparenchymal cyst. No peripancreatic edema. Spleen: No splenomegaly. No suspicious focal mass lesion. Adrenals/Urinary Tract: No adrenal nodule or mass. No renal stone disease. Subtle increased density noted in the papillae which could reflect concentrated urine or subtle changes of medullary nephrocalcinosis. No evidence for hydroureter. The urinary bladder appears normal for the degree of distention. Stomach/Bowel: Stomach is unremarkable. No gastric wall thickening. No evidence of outlet obstruction. Duodenum is normally positioned as is the ligament of Treitz. No small bowel wall thickening. No small bowel dilatation. The terminal ileum is normal. The appendix is normal. No gross colonic mass. Colon is diffusely decompressed which accentuates wall thickness. Vascular/Lymphatic: No abdominal aortic aneurysm. No abdominal aortic atherosclerotic calcification. There is no gastrohepatic or hepatoduodenal ligament lymphadenopathy. No retroperitoneal or mesenteric lymphadenopathy. No pelvic sidewall lymphadenopathy. Reproductive: The uterus is unremarkable.  There is no adnexal mass. Other: No intraperitoneal free fluid. Musculoskeletal: No worrisome lytic or sclerotic osseous abnormality. IMPRESSION: 1. No acute findings in the abdomen or pelvis.  Specifically, no findings to explain the patient's history of abdominal pain and vomiting. Electronically Signed   By: Camellia Candle M.D.   On: 12/06/2023 05:32   DG Chest Portable 1 View Result Date: 12/06/2023 CLINICAL DATA:  Cough. EXAM: PORTABLE CHEST 1 VIEW COMPARISON:  10/02/2014 FINDINGS: Low volume film. The lungs are clear without focal pneumonia, edema, pneumothorax or pleural effusion. Cardiopericardial silhouette is at upper limits of normal for size. No acute bony abnormality. IMPRESSION: No active disease. Electronically Signed   By: Camellia Candle M.D.   On: 12/06/2023 05:26     Procedures   Medications Ordered in the ED  acetaminophen  (TYLENOL ) tablet 650 mg (650 mg Oral Given 12/05/23 1954)  ondansetron  (ZOFRAN -ODT) disintegrating tablet 8 mg (8 mg Oral Given 12/06/23 0416)  ketorolac  (TORADOL ) injection 60 mg (60 mg Intramuscular Given 12/06/23 0415)                                    Medical Decision Making Viral symptoms x 2 weeks  Amount and/or Complexity of Data Reviewed External Data Reviewed: notes.    Details: Previous notes reviewed  Labs: ordered.    Details: Urine is negative for UTI. Normal sodium 138, normal potassium 3.5, normal creatinine. Negative covid and flu. White count slight elevation 16, normal hemoglobin count  Radiology: ordered and independent interpretation performed.    Details: Negative CXR, negative CT  Risk Prescription drug management. Risk Details: Well appearing.  This is not pancreatitis, no tenderness. No surgical or bacterial causes for illness.  This is a viral illness.  Patient is sleeping soundly in the room on multiple rechecks.  Needs a note for work.  Stable for discharge.       Final diagnoses:  Viral illness  Vomiting and diarrhea   No signs of systemic illness or infection. The patient is nontoxic-appearing on exam and vital signs are within  normal limits.  I have reviewed the triage vital signs and the nursing notes.  Pertinent labs & imaging results that were available during my care of the patient were reviewed by me and considered in my medical decision making (see chart for details). After history, exam, and medical workup I feel the patient has been appropriately medically screened and is safe for discharge home. Pertinent diagnoses were discussed with the patient. Patient was given return precautions.    ED Discharge Orders          Ordered    ondansetron  (ZOFRAN -ODT) 8 MG disintegrating tablet        12/06/23 0601    dicyclomine (BENTYL) 20 MG tablet  2 times daily        12/06/23 0601               Tenya Araque, MD 12/06/23 [phone]

## 2023-12-24 NOTE — Patient Instructions (Incomplete)
  1. Skin testing to  select foods  and environmental allergens on 11/20/23 was negative with adequate controls.  Intradermal skin testing was positive to mold mix 1, major mold mix 2, and dog She declined getting lab work to select foods since she eats them all the time without any problems.  Have access to your epinephrine  autoinjector device at all times.  Continue avoidance measures as below Continue cromolyn  eye drops using 1 drop in each eye up to 4 times a day as needed for itchy watery eyes.  2. Epi-pen, benadryl (MD/ER evaluation) for allergic reaction  3. Continue nasal steroid and antihistamine during spring and fall  4. Influenza = Tamiflu. Covid = Paxlovid  5. Plan for fall flu vaccine  6. Follow up in months or sooner if needed  Control of Dog or Cat Allergen Avoidance is the best way to manage a dog or cat allergy . If you have a dog or cat and are allergic to dog or cats, consider removing the dog or cat from the home. If you have a dog or cat but don't want to find it a new home, or if your family wants a pet even though someone in the household is allergic, here are some strategies that may help keep symptoms at bay:  Keep the pet out of your bedroom and restrict it to only a few rooms. Be advised that keeping the dog or cat in only one room will not limit the allergens to that room. Don't pet, hug or kiss the dog or cat; if you do, wash your hands with soap and water. High-efficiency particulate air (HEPA) cleaners run continuously in a bedroom or living room can reduce allergen levels over time. Regular use of a high-efficiency vacuum cleaner or a central vacuum can reduce allergen levels. Giving your dog or cat a bath at least once a week can reduce airborne allergen.  Control of Mold Allergen Mold and fungi can grow on a variety of surfaces provided certain temperature and moisture conditions exist.  Outdoor molds grow on plants, decaying vegetation and soil.  The major  outdoor mold, Alternaria and Cladosporium, are found in very high numbers during hot and dry conditions.  Generally, a late Summer - Fall peak is seen for common outdoor fungal spores.  Rain will temporarily lower outdoor mold spore count, but counts rise rapidly when the rainy period ends.  The most important indoor molds are Aspergillus and Penicillium.  Dark, humid and poorly ventilated basements are ideal sites for mold growth.  The next most common sites of mold growth are the bathroom and the kitchen.  Outdoor [employer] Use air conditioning and keep windows closed Avoid exposure to decaying vegetation. Avoid leaf raking. Avoid grain handling. Consider wearing a face mask if working in moldy areas.  Indoor Mold Control Maintain humidity below 50%. Clean washable surfaces with 5% bleach solution. Remove sources e.g. Contaminated carpets.

## 2023-12-25 ENCOUNTER — Ambulatory Visit: Admitting: Family
